# Patient Record
Sex: Female | Born: 1950 | Race: White | Hispanic: No | Smoking: Current some day smoker
Health system: Southern US, Community
[De-identification: ages and names within clinical notes are randomized; demographics above are authoritative.]

## PROBLEM LIST (undated history)

## (undated) DIAGNOSIS — I10 Essential (primary) hypertension: Secondary | ICD-10-CM

## (undated) DIAGNOSIS — M1990 Unspecified osteoarthritis, unspecified site: Secondary | ICD-10-CM

## (undated) DIAGNOSIS — J439 Emphysema, unspecified: Secondary | ICD-10-CM

## (undated) DIAGNOSIS — E785 Hyperlipidemia, unspecified: Secondary | ICD-10-CM

## (undated) DIAGNOSIS — F419 Anxiety disorder, unspecified: Secondary | ICD-10-CM

## (undated) DIAGNOSIS — E079 Disorder of thyroid, unspecified: Secondary | ICD-10-CM

## (undated) DIAGNOSIS — T7840XA Allergy, unspecified, initial encounter: Secondary | ICD-10-CM

## (undated) HISTORY — DX: Anxiety disorder, unspecified: F41.9

## (undated) HISTORY — DX: Essential (primary) hypertension: I10

## (undated) HISTORY — DX: Emphysema, unspecified: J43.9

## (undated) HISTORY — DX: Unspecified osteoarthritis, unspecified site: M19.90

## (undated) HISTORY — PX: COLONOSCOPY: SHX174

## (undated) HISTORY — DX: Allergy, unspecified, initial encounter: T78.40XA

## (undated) HISTORY — DX: Disorder of thyroid, unspecified: E07.9

## (undated) HISTORY — PX: POLYPECTOMY: SHX149

## (undated) HISTORY — DX: Hyperlipidemia, unspecified: E78.5

---

## 1976-02-18 HISTORY — PX: VAGINAL HYSTERECTOMY: SHX2639

## 1997-05-24 ENCOUNTER — Other Ambulatory Visit: Admission: RE | Admit: 1997-05-24 | Discharge: 1997-05-24 | Payer: Self-pay | Admitting: *Deleted

## 1998-02-23 ENCOUNTER — Ambulatory Visit (HOSPITAL_COMMUNITY): Admission: RE | Admit: 1998-02-23 | Discharge: 1998-02-23 | Payer: Self-pay | Admitting: Ophthalmology

## 1998-04-25 ENCOUNTER — Other Ambulatory Visit: Admission: RE | Admit: 1998-04-25 | Discharge: 1998-04-25 | Payer: Self-pay | Admitting: *Deleted

## 1999-06-24 ENCOUNTER — Other Ambulatory Visit: Admission: RE | Admit: 1999-06-24 | Discharge: 1999-06-24 | Payer: Self-pay | Admitting: *Deleted

## 2000-08-27 ENCOUNTER — Other Ambulatory Visit: Admission: RE | Admit: 2000-08-27 | Discharge: 2000-08-27 | Payer: Self-pay | Admitting: *Deleted

## 2006-01-26 ENCOUNTER — Ambulatory Visit: Payer: Self-pay | Admitting: Internal Medicine

## 2006-03-11 ENCOUNTER — Encounter (INDEPENDENT_AMBULATORY_CARE_PROVIDER_SITE_OTHER): Payer: Self-pay | Admitting: Specialist

## 2006-03-11 ENCOUNTER — Ambulatory Visit: Payer: Self-pay | Admitting: Internal Medicine

## 2008-11-18 ENCOUNTER — Ambulatory Visit: Payer: Self-pay | Admitting: Family Medicine

## 2008-11-18 DIAGNOSIS — J069 Acute upper respiratory infection, unspecified: Secondary | ICD-10-CM | POA: Insufficient documentation

## 2008-11-18 DIAGNOSIS — F411 Generalized anxiety disorder: Secondary | ICD-10-CM | POA: Insufficient documentation

## 2008-11-18 DIAGNOSIS — I10 Essential (primary) hypertension: Secondary | ICD-10-CM | POA: Insufficient documentation

## 2008-11-18 DIAGNOSIS — E785 Hyperlipidemia, unspecified: Secondary | ICD-10-CM | POA: Insufficient documentation

## 2013-02-23 ENCOUNTER — Encounter: Payer: Self-pay | Admitting: Internal Medicine

## 2013-05-19 ENCOUNTER — Other Ambulatory Visit: Payer: Self-pay | Admitting: Internal Medicine

## 2013-05-19 DIAGNOSIS — Z720 Tobacco use: Secondary | ICD-10-CM

## 2013-05-19 DIAGNOSIS — R059 Cough, unspecified: Secondary | ICD-10-CM

## 2013-05-19 DIAGNOSIS — R05 Cough: Secondary | ICD-10-CM

## 2013-05-24 ENCOUNTER — Ambulatory Visit
Admission: RE | Admit: 2013-05-24 | Discharge: 2013-05-24 | Disposition: A | Discharge status: Home / Self Care | Payer: BC Managed Care – PPO | Source: Ambulatory Visit | Attending: Internal Medicine | Admitting: Internal Medicine

## 2013-05-24 DIAGNOSIS — R059 Cough, unspecified: Secondary | ICD-10-CM

## 2013-05-24 DIAGNOSIS — R05 Cough: Secondary | ICD-10-CM

## 2013-05-24 DIAGNOSIS — Z720 Tobacco use: Secondary | ICD-10-CM

## 2015-03-18 IMAGING — CT CT CHEST W/O CM
3 of 4 series · 16 of 30 positions shown, 17 images · non-contrast
Comparison: None.

CLINICAL DATA: Cough.  Short of breath.  Cigarette smoker.

EXAM:
CT CHEST WITHOUT CONTRAST
TECHNIQUE: Multidetector CT imaging of the chest was performed following the
standard protocol without IV contrast.

[Series 3: chest w/o · axial · non-contrast · 0.70mm/px · z∈[-210,-40]mm · 4 of 58 slices shown, 5 images]
[im 12/58  mediastinal]
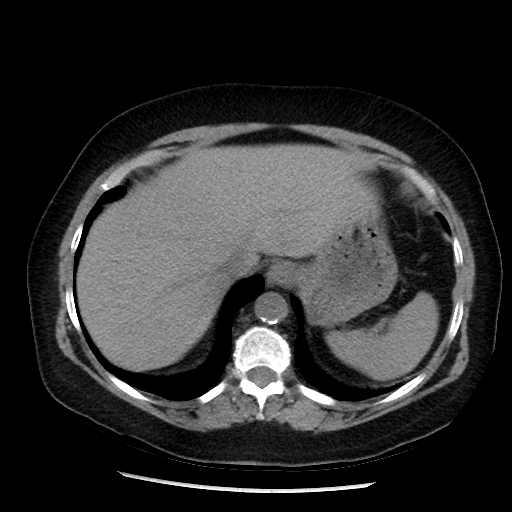
[im 12/58  lung]
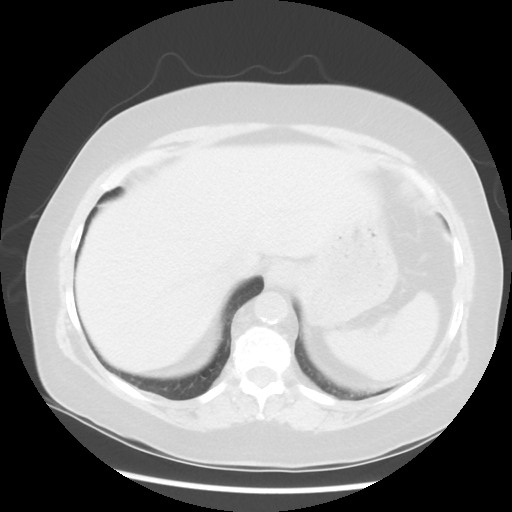
[im 23/58  lung]
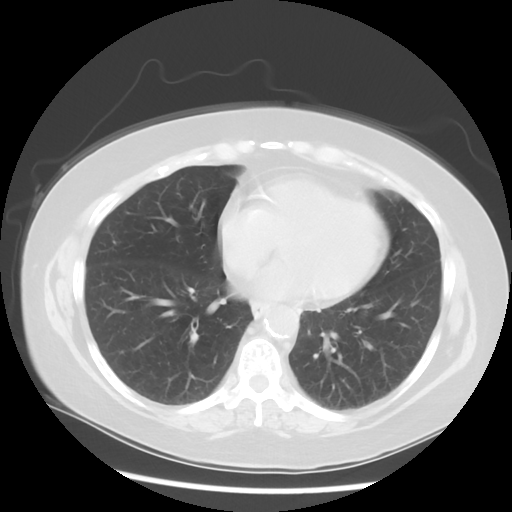
[im 35/58  lung]
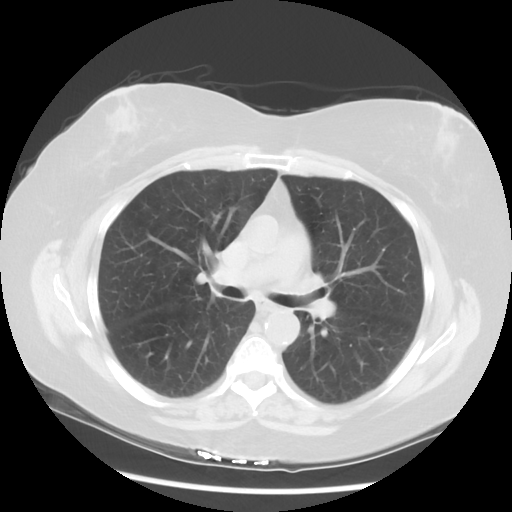
[im 46/58  lung]
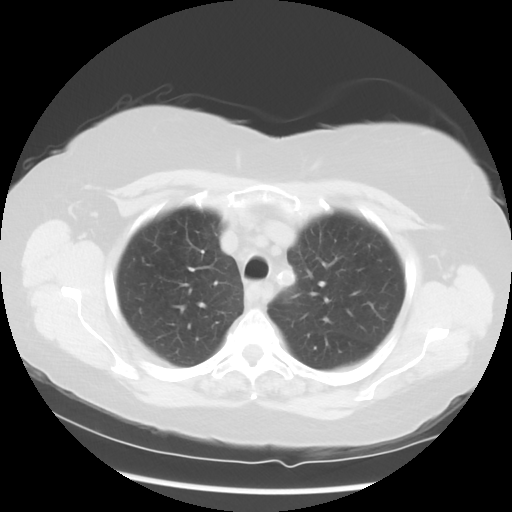

[Series 4: lung windows · axial · 0.70mm/px · z∈[-210,-40]mm · 4 of 58 slices shown]
[im 12/58  lung]
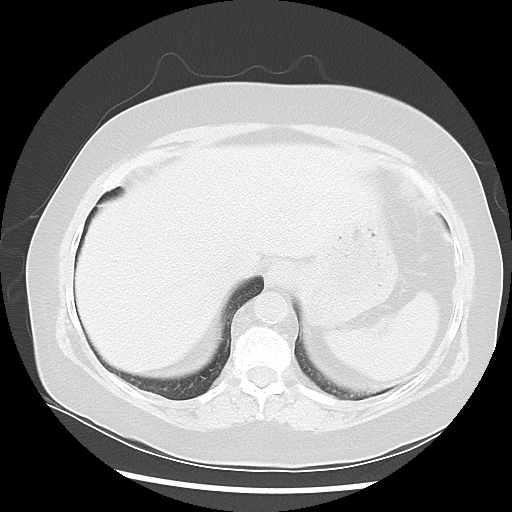
[im 23/58  lung]
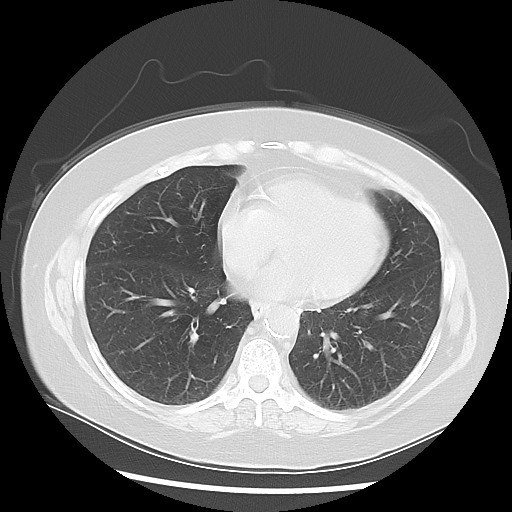
[im 35/58  lung]
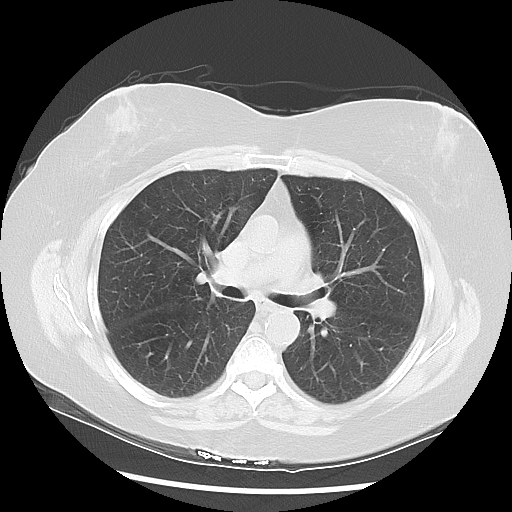
[im 46/58  lung]
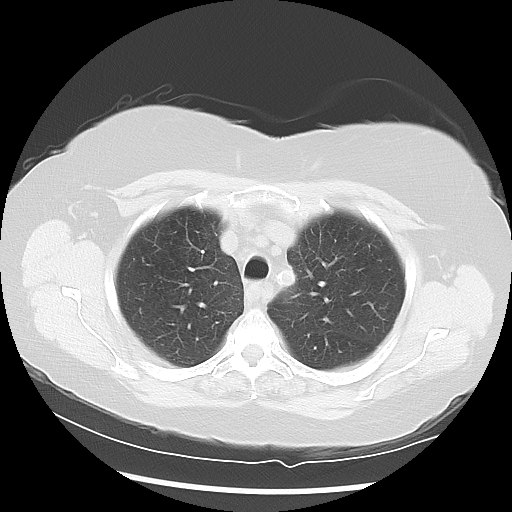

[Series 602: sagittal body · sagittal · 0.70mm/px · 8 of 145 slices shown]
[im 10/145  mediastinal]
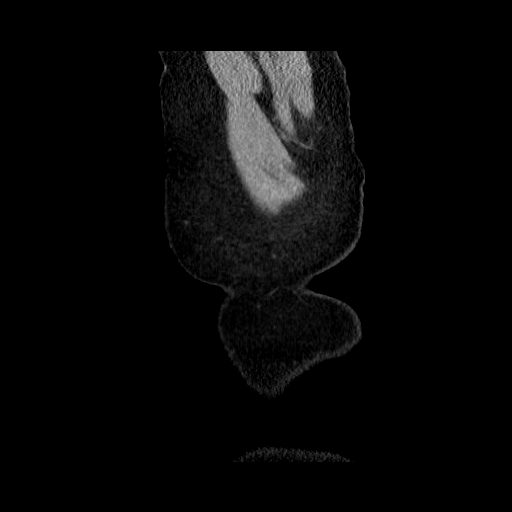
[im 29/145  mediastinal]
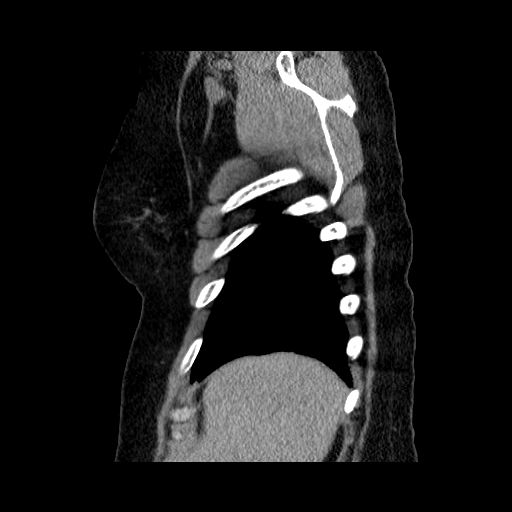
[im 49/145  mediastinal]
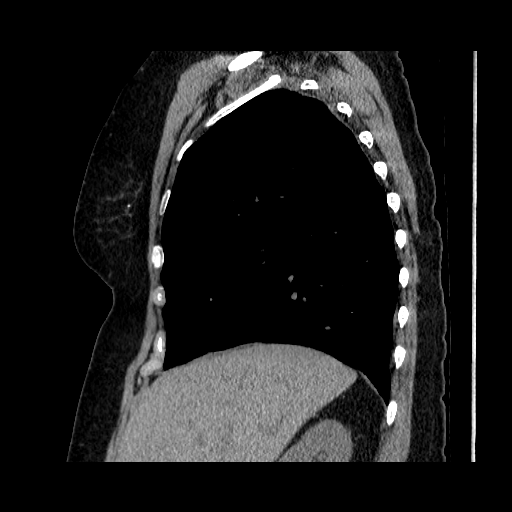
[im 68/145  mediastinal]
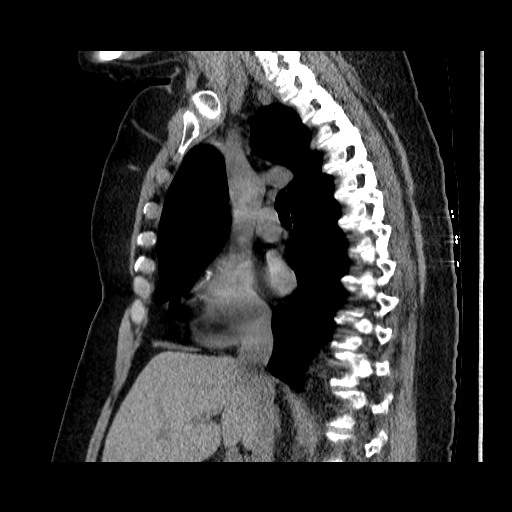
[im 77/145  mediastinal]
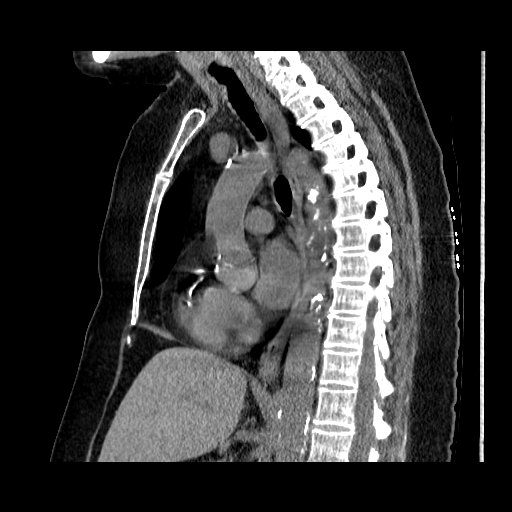
[im 97/145  mediastinal]
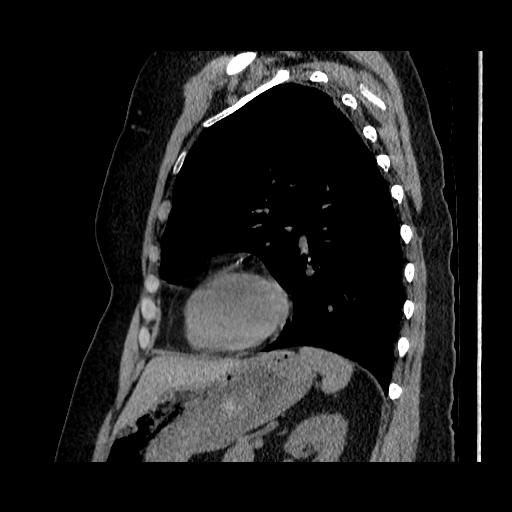
[im 116/145  mediastinal]
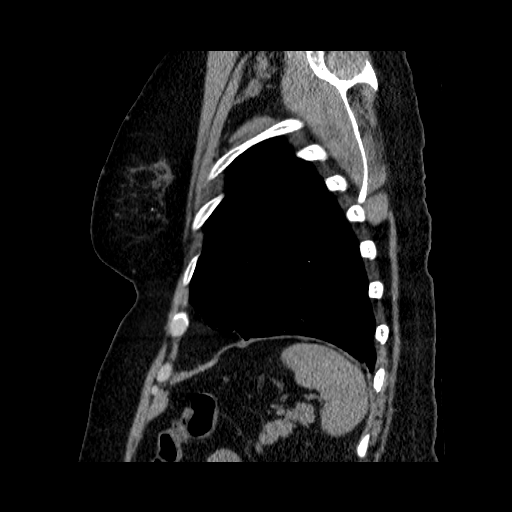
[im 135/145  mediastinal]
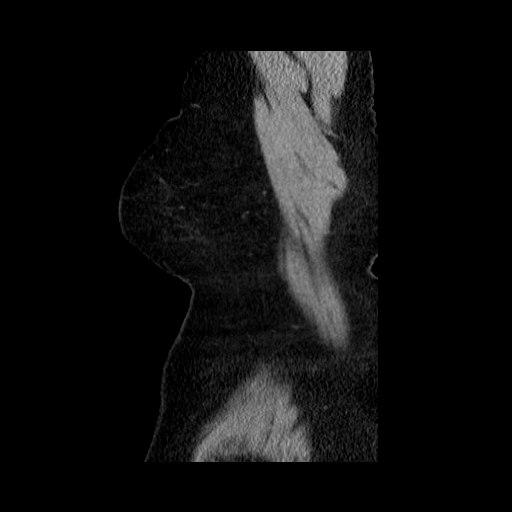

[16 of 30 positions shown; findings below may reference images not displayed]

FINDINGS: There is no axillary adenopathy. No mediastinal adenopathy. No hilar
adenopathy allowing for noncontrast technique. Coronary artery
atherosclerosis is present. If office based assessment of coronary
risk factors has not been performed, it is now recommended. No
pericardial or pleural effusion. Aortic and branch vessel
atherosclerotic calcification is present. Aberrant right subclavian
artery is present which passes posterior to the esophagus, and also
demonstrates atherosclerotic calcification. Incidental imaging of
the upper abdomen is within normal limits. The lungs demonstrate
moderate centrilobular emphysema. No pulmonary mass lesion. Mild
dependent atelectasis. There are no aggressive osseous lesions.
Striated pattern of the bones in the thoracic vertebral column
suggesting osteopenia.
IMPRESSION: 1. Emphysema.
2. Atherosclerosis and coronary artery disease.
3. No active cardiopulmonary disease.

## 2016-02-29 ENCOUNTER — Other Ambulatory Visit: Payer: Self-pay | Admitting: Internal Medicine

## 2016-02-29 DIAGNOSIS — Z720 Tobacco use: Secondary | ICD-10-CM

## 2016-03-13 ENCOUNTER — Encounter: Payer: Self-pay | Admitting: Gastroenterology

## 2016-04-07 ENCOUNTER — Encounter: Payer: Self-pay | Admitting: Gastroenterology

## 2016-05-29 ENCOUNTER — Ambulatory Visit (AMBULATORY_SURGERY_CENTER): Payer: Self-pay

## 2016-05-29 VITALS — Ht 59.0 in | Wt 156.8 lb

## 2016-05-29 DIAGNOSIS — Z1211 Encounter for screening for malignant neoplasm of colon: Secondary | ICD-10-CM

## 2016-05-29 MED ORDER — NA SULFATE-K SULFATE-MG SULF 17.5-3.13-1.6 GM/177ML PO SOLN: 1.0000 | Freq: Once | ORAL | 0 refills | Status: AC

## 2016-05-29 NOTE — Progress Notes (Signed)
Denies allergies to eggs or soy products. Denies complication of anesthesia or sedation. Denies use of weight loss medication. Denies use of O2.   Emmi instructions declined, patient doesn't have a computer.

## 2016-05-30 ENCOUNTER — Encounter: Payer: Self-pay | Admitting: Gastroenterology

## 2016-06-10 ENCOUNTER — Encounter: Payer: Self-pay | Admitting: Gastroenterology

## 2016-06-10 ENCOUNTER — Ambulatory Visit (AMBULATORY_SURGERY_CENTER): Payer: Medicare Other | Admitting: Gastroenterology

## 2016-06-10 VITALS — BP 119/66 | HR 84 | Temp 98.4°F | Resp 14 | Ht 59.0 in | Wt 156.0 lb

## 2016-06-10 DIAGNOSIS — K621 Rectal polyp: Secondary | ICD-10-CM | POA: Diagnosis not present

## 2016-06-10 DIAGNOSIS — Z1212 Encounter for screening for malignant neoplasm of rectum: Secondary | ICD-10-CM | POA: Diagnosis not present

## 2016-06-10 DIAGNOSIS — D128 Benign neoplasm of rectum: Secondary | ICD-10-CM

## 2016-06-10 DIAGNOSIS — Z800 Family history of malignant neoplasm of digestive organs: Secondary | ICD-10-CM | POA: Diagnosis not present

## 2016-06-10 DIAGNOSIS — Z1211 Encounter for screening for malignant neoplasm of colon: Secondary | ICD-10-CM

## 2016-06-10 MED ORDER — SODIUM CHLORIDE 0.9 % IV SOLN: 500.0000 mL | INTRAVENOUS | Status: DC

## 2016-06-10 NOTE — Op Note (Signed)
[facility] Patient Name: Olivia Cook Procedure Date: 06/10/2016 9:07 AM MRN: [id number] Endoscopist: Remo Lipps P. Sagan Wurzel MD, MD Age: 66 Referring MD:  Date of Birth: [birth year] Gender: Female Account #: [id number] Procedure:                Colonoscopy Indications:              Screening in patient at increased risk: Family                            history of 1st-degree relative with colorectal                            cancer (sister age 66s) Medicines:                Monitored Anesthesia Care Procedure:                Pre-Anesthesia Assessment:                           - Prior to the procedure, a History and Physical                            was performed, and patient medications and                            allergies were reviewed. The patient's tolerance of                            previous anesthesia was also reviewed. The risks                            and benefits of the procedure and the sedation                            options and risks were discussed with the patient.                            All questions were answered, and informed consent                            was obtained. Prior Anticoagulants: The patient has                            taken no previous anticoagulant or antiplatelet                            agents. ASA Grade Assessment: II - A patient with                            mild systemic disease. After reviewing the risks                            and benefits, the patient was deemed in  satisfactory condition to undergo the procedure.                           After obtaining informed consent, the colonoscope                            was passed under direct vision. Throughout the                            procedure, the patient's blood pressure, pulse, and                            oxygen saturations were monitored continuously. The                            Colonoscope was introduced through  the anus and                            advanced to the the cecum, identified by                            appendiceal orifice and ileocecal valve. The                            colonoscopy was performed without difficulty. The                            patient tolerated the procedure well. The quality                            of the bowel preparation was good. The terminal                            ileum, ileocecal valve, appendiceal orifice, and                            rectum were photographed. Scope In: 9:11:11 AM Scope Out: 9:28:51 AM Scope Withdrawal Time: 0 hours 15 minutes 22 seconds  Total Procedure Duration: 0 hours 17 minutes 40 seconds  Findings:                 The perianal and digital rectal examinations were                            normal.                           Five sessile polyps were found in the rectum. The                            polyps were 3 to 4 mm in size. These polyps were                            removed with a cold snare. Resection and retrieval  were complete.                           Many medium-mouthed diverticula were found in the                            left colon.                           Internal hemorrhoids were found during retroflexion.                           The exam was otherwise without abnormality. Complications:            No immediate complications. Estimated blood loss:                            Minimal. Estimated Blood Loss:     Estimated blood loss was minimal. Impression:               - Five 3 to 4 mm polyps in the rectum, removed with                            a cold snare. Resected and retrieved. Suspect                            benign hyperplastic polyps                           - Diverticulosis in the left colon.                           - Internal hemorrhoids.                           - The examination was otherwise normal. Recommendation:           - Patient has a contact  number available for                            emergencies. The signs and symptoms of potential                            delayed complications were discussed with the                            patient. Return to normal activities tomorrow.                            Written discharge instructions were provided to the                            patient.                           - Resume previous diet.                           -  Continue present medications.                           - No ibuprofen, naproxen, or other non-steroidal                            anti-inflammatory drugs for 2 weeks after polyp                            removal.                           - Await pathology results.                           - Repeat colonoscopy is recommended for                            surveillance. The colonoscopy date will be                            determined after pathology results from today's                            exam become available for review. Remo Lipps P. Carah Barrientes MD, MD 06/10/2016 9:33:26 AM This report has been signed electronically.

## 2016-06-10 NOTE — Progress Notes (Signed)
Pt's states no medical or surgical changes since previsit or office visit. 

## 2016-06-10 NOTE — Progress Notes (Signed)
TO PACU  Pt awake and alert, report to RN 

## 2016-06-10 NOTE — Progress Notes (Signed)
Called to room to assist during endoscopic procedure.  Patient ID and intended procedure confirmed with present staff. Received instructions for my participation in the procedure from the performing physician.  

## 2016-06-10 NOTE — Patient Instructions (Signed)
Handout given on polyps  YOU HAD AN ENDOSCOPIC PROCEDURE TODAY: Refer to the procedure report and other information in the discharge instructions given to you for any specific questions about what was found during the examination. If this information does not answer your questions, please call [facility] office at [phone] to clarify.   YOU SHOULD EXPECT: Some feelings of bloating in the abdomen. Passage of more gas than usual. Walking can help get rid of the air that was put into your GI tract during the procedure and reduce the bloating. If you had a lower endoscopy (such as a colonoscopy or flexible sigmoidoscopy) you may notice spotting of blood in your stool or on the toilet paper. Some abdominal soreness may be present for a day or two, also.  DIET: Your first meal following the procedure should be a light meal and then it is ok to progress to your normal diet. A half-sandwich or bowl of soup is an example of a good first meal. Heavy or fried foods are harder to digest and may make you feel nauseous or bloated. Drink plenty of fluids but you should avoid alcoholic beverages for 24 hours. If you had a esophageal dilation, please see attached instructions for diet.    ACTIVITY: Your care partner should take you home directly after the procedure. You should plan to take it easy, moving slowly for the rest of the day. You can resume normal activity the day after the procedure however YOU SHOULD NOT DRIVE, use power tools, machinery or perform tasks that involve climbing or major physical exertion for 24 hours (because of the sedation medicines used during the test).   SYMPTOMS TO REPORT IMMEDIATELY: A gastroenterologist can be reached at any hour. Please call [phone]  for any of the following symptoms:  Following lower endoscopy (colonoscopy, flexible sigmoidoscopy) Excessive amounts of blood in the stool  Significant tenderness, worsening of abdominal pains  Swelling of the abdomen that is  new, acute  Fever of 100 or higher    FOLLOW UP:  If any biopsies were taken you will be contacted by phone or by letter within the next 1-3 weeks. Call [phone]  if you have not heard about the biopsies in 3 weeks.  Please also call with any specific questions about appointments or follow up tests.  

## 2016-06-11 ENCOUNTER — Telehealth: Payer: Self-pay

## 2016-06-11 NOTE — Telephone Encounter (Signed)
  Follow up Call-  Call back number 06/10/2016  Post procedure Call Back phone  # [phone]  Permission to leave phone message Yes  Some recent data might be hidden     Patient questions:  Do you have a fever, pain , or abdominal swelling? No. Pain Score  0 *  Have you tolerated food without any problems? Yes.    Have you been able to return to your normal activities? Yes.    Do you have any questions about your discharge instructions: Diet   No. Medications  No. Follow up visit  No.  Do you have questions or concerns about your Care? No.  Actions: * If pain score is 4 or above: No action needed, pain <4.

## 2016-06-13 ENCOUNTER — Encounter: Payer: Self-pay | Admitting: Gastroenterology

## 2018-10-22 ENCOUNTER — Other Ambulatory Visit (HOSPITAL_COMMUNITY): Payer: Self-pay | Admitting: Internal Medicine

## 2018-10-22 DIAGNOSIS — R0989 Other specified symptoms and signs involving the circulatory and respiratory systems: Secondary | ICD-10-CM

## 2018-10-27 ENCOUNTER — Other Ambulatory Visit: Payer: Self-pay

## 2018-10-27 ENCOUNTER — Ambulatory Visit (HOSPITAL_COMMUNITY)
Admission: RE | Admit: 2018-10-27 | Discharge: 2018-10-27 | Disposition: A | Discharge status: Home / Self Care | Payer: Medicare Other | Source: Ambulatory Visit | Attending: Family | Admitting: Family

## 2018-10-27 DIAGNOSIS — R0989 Other specified symptoms and signs involving the circulatory and respiratory systems: Secondary | ICD-10-CM | POA: Diagnosis present

## 2019-11-30 DIAGNOSIS — Z1231 Encounter for screening mammogram for malignant neoplasm of breast: Secondary | ICD-10-CM | POA: Diagnosis not present

## 2020-02-21 DIAGNOSIS — R110 Nausea: Secondary | ICD-10-CM | POA: Diagnosis not present

## 2020-02-21 DIAGNOSIS — R5383 Other fatigue: Secondary | ICD-10-CM | POA: Diagnosis not present

## 2020-02-21 DIAGNOSIS — R195 Other fecal abnormalities: Secondary | ICD-10-CM | POA: Diagnosis not present

## 2020-02-21 DIAGNOSIS — J439 Emphysema, unspecified: Secondary | ICD-10-CM | POA: Diagnosis not present

## 2020-02-21 DIAGNOSIS — R059 Cough, unspecified: Secondary | ICD-10-CM | POA: Diagnosis not present

## 2020-02-21 DIAGNOSIS — Z8719 Personal history of other diseases of the digestive system: Secondary | ICD-10-CM | POA: Diagnosis not present

## 2020-02-21 DIAGNOSIS — J189 Pneumonia, unspecified organism: Secondary | ICD-10-CM | POA: Diagnosis not present

## 2020-02-21 DIAGNOSIS — Z1152 Encounter for screening for COVID-19: Secondary | ICD-10-CM | POA: Diagnosis not present

## 2020-03-13 DIAGNOSIS — R059 Cough, unspecified: Secondary | ICD-10-CM | POA: Diagnosis not present

## 2020-03-28 DIAGNOSIS — J439 Emphysema, unspecified: Secondary | ICD-10-CM | POA: Diagnosis not present

## 2020-03-28 DIAGNOSIS — J984 Other disorders of lung: Secondary | ICD-10-CM | POA: Diagnosis not present

## 2020-04-12 DIAGNOSIS — N281 Cyst of kidney, acquired: Secondary | ICD-10-CM | POA: Diagnosis not present

## 2020-04-19 ENCOUNTER — Other Ambulatory Visit (HOSPITAL_COMMUNITY): Payer: Self-pay | Admitting: Urology

## 2020-04-19 ENCOUNTER — Other Ambulatory Visit: Payer: Self-pay | Admitting: Urology

## 2020-04-19 DIAGNOSIS — N281 Cyst of kidney, acquired: Secondary | ICD-10-CM

## 2020-04-26 DIAGNOSIS — E782 Mixed hyperlipidemia: Secondary | ICD-10-CM | POA: Diagnosis not present

## 2020-04-26 DIAGNOSIS — E039 Hypothyroidism, unspecified: Secondary | ICD-10-CM | POA: Diagnosis not present

## 2020-04-26 DIAGNOSIS — R7301 Impaired fasting glucose: Secondary | ICD-10-CM | POA: Diagnosis not present

## 2020-04-27 ENCOUNTER — Ambulatory Visit (HOSPITAL_COMMUNITY): Payer: Medicare PPO

## 2020-04-27 ENCOUNTER — Encounter (HOSPITAL_COMMUNITY): Payer: Self-pay

## 2020-05-02 DIAGNOSIS — K760 Fatty (change of) liver, not elsewhere classified: Secondary | ICD-10-CM | POA: Diagnosis not present

## 2020-05-02 DIAGNOSIS — N281 Cyst of kidney, acquired: Secondary | ICD-10-CM | POA: Diagnosis not present

## 2020-05-03 DIAGNOSIS — R82998 Other abnormal findings in urine: Secondary | ICD-10-CM | POA: Diagnosis not present

## 2020-05-03 DIAGNOSIS — R911 Solitary pulmonary nodule: Secondary | ICD-10-CM | POA: Diagnosis not present

## 2020-05-03 DIAGNOSIS — R7301 Impaired fasting glucose: Secondary | ICD-10-CM | POA: Diagnosis not present

## 2020-05-03 DIAGNOSIS — Z720 Tobacco use: Secondary | ICD-10-CM | POA: Diagnosis not present

## 2020-05-03 DIAGNOSIS — R21 Rash and other nonspecific skin eruption: Secondary | ICD-10-CM | POA: Diagnosis not present

## 2020-05-03 DIAGNOSIS — Z1339 Encounter for screening examination for other mental health and behavioral disorders: Secondary | ICD-10-CM | POA: Diagnosis not present

## 2020-05-03 DIAGNOSIS — Z0000 Encounter for general adult medical examination without abnormal findings: Secondary | ICD-10-CM | POA: Diagnosis not present

## 2020-05-03 DIAGNOSIS — I10 Essential (primary) hypertension: Secondary | ICD-10-CM | POA: Diagnosis not present

## 2020-05-03 DIAGNOSIS — Z1331 Encounter for screening for depression: Secondary | ICD-10-CM | POA: Diagnosis not present

## 2020-05-03 DIAGNOSIS — E039 Hypothyroidism, unspecified: Secondary | ICD-10-CM | POA: Diagnosis not present

## 2020-05-03 DIAGNOSIS — E782 Mixed hyperlipidemia: Secondary | ICD-10-CM | POA: Diagnosis not present

## 2020-05-03 DIAGNOSIS — Z1212 Encounter for screening for malignant neoplasm of rectum: Secondary | ICD-10-CM | POA: Diagnosis not present

## 2020-05-03 DIAGNOSIS — J439 Emphysema, unspecified: Secondary | ICD-10-CM | POA: Diagnosis not present

## 2020-05-07 DIAGNOSIS — N281 Cyst of kidney, acquired: Secondary | ICD-10-CM | POA: Diagnosis not present

## 2020-08-08 DIAGNOSIS — R911 Solitary pulmonary nodule: Secondary | ICD-10-CM | POA: Diagnosis not present

## 2020-08-08 DIAGNOSIS — J432 Centrilobular emphysema: Secondary | ICD-10-CM | POA: Diagnosis not present

## 2020-08-08 DIAGNOSIS — J9811 Atelectasis: Secondary | ICD-10-CM | POA: Diagnosis not present

## 2020-08-08 DIAGNOSIS — J449 Chronic obstructive pulmonary disease, unspecified: Secondary | ICD-10-CM | POA: Diagnosis not present

## 2020-10-25 DIAGNOSIS — R0602 Shortness of breath: Secondary | ICD-10-CM | POA: Diagnosis not present

## 2020-10-25 DIAGNOSIS — J069 Acute upper respiratory infection, unspecified: Secondary | ICD-10-CM | POA: Diagnosis not present

## 2020-10-25 DIAGNOSIS — R058 Other specified cough: Secondary | ICD-10-CM | POA: Diagnosis not present

## 2020-10-25 DIAGNOSIS — R0981 Nasal congestion: Secondary | ICD-10-CM | POA: Diagnosis not present

## 2020-10-25 DIAGNOSIS — Z1152 Encounter for screening for COVID-19: Secondary | ICD-10-CM | POA: Diagnosis not present

## 2020-10-29 DIAGNOSIS — R5383 Other fatigue: Secondary | ICD-10-CM | POA: Diagnosis not present

## 2020-10-29 DIAGNOSIS — J069 Acute upper respiratory infection, unspecified: Secondary | ICD-10-CM | POA: Diagnosis not present

## 2020-10-29 DIAGNOSIS — R058 Other specified cough: Secondary | ICD-10-CM | POA: Diagnosis not present

## 2020-11-12 DIAGNOSIS — J439 Emphysema, unspecified: Secondary | ICD-10-CM | POA: Diagnosis not present

## 2020-11-12 DIAGNOSIS — I10 Essential (primary) hypertension: Secondary | ICD-10-CM | POA: Diagnosis not present

## 2020-11-12 DIAGNOSIS — R7301 Impaired fasting glucose: Secondary | ICD-10-CM | POA: Diagnosis not present

## 2020-11-12 DIAGNOSIS — Z720 Tobacco use: Secondary | ICD-10-CM | POA: Diagnosis not present

## 2020-11-12 DIAGNOSIS — E039 Hypothyroidism, unspecified: Secondary | ICD-10-CM | POA: Diagnosis not present

## 2020-11-12 DIAGNOSIS — Z23 Encounter for immunization: Secondary | ICD-10-CM | POA: Diagnosis not present

## 2021-01-28 DIAGNOSIS — M85852 Other specified disorders of bone density and structure, left thigh: Secondary | ICD-10-CM | POA: Diagnosis not present

## 2021-01-28 DIAGNOSIS — Z780 Asymptomatic menopausal state: Secondary | ICD-10-CM | POA: Diagnosis not present

## 2021-01-28 DIAGNOSIS — Z1231 Encounter for screening mammogram for malignant neoplasm of breast: Secondary | ICD-10-CM | POA: Diagnosis not present

## 2021-05-22 DIAGNOSIS — N281 Cyst of kidney, acquired: Secondary | ICD-10-CM | POA: Diagnosis not present

## 2021-06-07 DIAGNOSIS — N281 Cyst of kidney, acquired: Secondary | ICD-10-CM | POA: Diagnosis not present

## 2021-06-10 DIAGNOSIS — I10 Essential (primary) hypertension: Secondary | ICD-10-CM | POA: Diagnosis not present

## 2021-06-10 DIAGNOSIS — E039 Hypothyroidism, unspecified: Secondary | ICD-10-CM | POA: Diagnosis not present

## 2021-06-10 DIAGNOSIS — Z79899 Other long term (current) drug therapy: Secondary | ICD-10-CM | POA: Diagnosis not present

## 2021-06-10 DIAGNOSIS — E782 Mixed hyperlipidemia: Secondary | ICD-10-CM | POA: Diagnosis not present

## 2021-06-10 DIAGNOSIS — R7301 Impaired fasting glucose: Secondary | ICD-10-CM | POA: Diagnosis not present

## 2021-06-17 DIAGNOSIS — J439 Emphysema, unspecified: Secondary | ICD-10-CM | POA: Diagnosis not present

## 2021-06-17 DIAGNOSIS — Z0000 Encounter for general adult medical examination without abnormal findings: Secondary | ICD-10-CM | POA: Diagnosis not present

## 2021-06-17 DIAGNOSIS — I10 Essential (primary) hypertension: Secondary | ICD-10-CM | POA: Diagnosis not present

## 2021-06-17 DIAGNOSIS — Z720 Tobacco use: Secondary | ICD-10-CM | POA: Diagnosis not present

## 2021-06-17 DIAGNOSIS — E782 Mixed hyperlipidemia: Secondary | ICD-10-CM | POA: Diagnosis not present

## 2021-06-17 DIAGNOSIS — E039 Hypothyroidism, unspecified: Secondary | ICD-10-CM | POA: Diagnosis not present

## 2021-06-17 DIAGNOSIS — G8929 Other chronic pain: Secondary | ICD-10-CM | POA: Diagnosis not present

## 2021-06-17 DIAGNOSIS — R7301 Impaired fasting glucose: Secondary | ICD-10-CM | POA: Diagnosis not present

## 2021-06-17 DIAGNOSIS — R82998 Other abnormal findings in urine: Secondary | ICD-10-CM | POA: Diagnosis not present

## 2021-06-17 DIAGNOSIS — I700 Atherosclerosis of aorta: Secondary | ICD-10-CM | POA: Diagnosis not present

## 2021-07-02 ENCOUNTER — Encounter: Payer: Self-pay | Admitting: Gastroenterology

## 2021-07-12 ENCOUNTER — Encounter: Payer: Self-pay | Admitting: Gastroenterology

## 2021-07-31 ENCOUNTER — Ambulatory Visit (AMBULATORY_SURGERY_CENTER): Payer: Self-pay | Admitting: *Deleted

## 2021-07-31 VITALS — Ht 59.0 in | Wt 139.0 lb

## 2021-07-31 DIAGNOSIS — Z800 Family history of malignant neoplasm of digestive organs: Secondary | ICD-10-CM

## 2021-07-31 MED ORDER — NA SULFATE-K SULFATE-MG SULF 17.5-3.13-1.6 GM/177ML PO SOLN: 1.0000 | Freq: Once | ORAL | 0 refills | Status: AC

## 2021-07-31 NOTE — Progress Notes (Signed)
No egg or soy allergy known to patient  No issues known to pt with past sedation with any surgeries or procedures Patient denies ever being told they had issues or difficulty with intubation  No FH of Malignant Hyperthermia Pt is not on diet pills Pt is not on  home 02  Pt is not on blood thinners  Pt denies issues with constipation  No A fib or A flutter  NO PA's for preps discussed with pt In PV today  Discussed with pt there will be an out-of-pocket cost for prep and that varies from $0 to 70 +  dollars - pt verbalized understanding  Pt instructed to use GoodRx for a price reduction on prep

## 2021-08-12 ENCOUNTER — Other Ambulatory Visit (HOSPITAL_COMMUNITY): Payer: Self-pay

## 2021-08-12 ENCOUNTER — Telehealth: Payer: Self-pay | Admitting: Gastroenterology

## 2021-08-12 ENCOUNTER — Telehealth: Payer: Self-pay | Admitting: Pharmacy Technician

## 2021-08-12 DIAGNOSIS — Z800 Family history of malignant neoplasm of digestive organs: Secondary | ICD-10-CM

## 2021-08-12 MED ORDER — NA SULFATE-K SULFATE-MG SULF 17.5-3.13-1.6 GM/177ML PO SOLN: 1.0000 | Freq: Once | ORAL | 0 refills | Status: DC

## 2021-08-12 MED ORDER — NA SULFATE-K SULFATE-MG SULF 17.5-3.13-1.6 GM/177ML PO SOLN: 1.0000 | Freq: Once | ORAL | 0 refills | Status: AC

## 2021-08-13 NOTE — Telephone Encounter (Signed)
Pt was instructed to use Good Rx at [employer] 31-36 $ If she wants to change let me know  it would have to be changed to [place]- we do not do PA for bowel preps

## 2021-08-14 ENCOUNTER — Encounter: Payer: Self-pay | Admitting: Gastroenterology

## 2021-08-21 ENCOUNTER — Ambulatory Visit (AMBULATORY_SURGERY_CENTER): Payer: Medicare PPO | Admitting: Gastroenterology

## 2021-08-21 ENCOUNTER — Encounter: Payer: Self-pay | Admitting: Gastroenterology

## 2021-08-21 VITALS — BP 125/50 | HR 68 | Temp 98.2°F | Resp 21 | Ht 59.0 in | Wt 139.0 lb

## 2021-08-21 DIAGNOSIS — D479 Neoplasm of uncertain behavior of lymphoid, hematopoietic and related tissue, unspecified: Secondary | ICD-10-CM | POA: Diagnosis not present

## 2021-08-21 DIAGNOSIS — Z800 Family history of malignant neoplasm of digestive organs: Secondary | ICD-10-CM

## 2021-08-21 DIAGNOSIS — D125 Benign neoplasm of sigmoid colon: Secondary | ICD-10-CM

## 2021-08-21 DIAGNOSIS — Z1211 Encounter for screening for malignant neoplasm of colon: Secondary | ICD-10-CM

## 2021-08-21 MED ORDER — SODIUM CHLORIDE 0.9 % IV SOLN: 500.0000 mL | Freq: Once | INTRAVENOUS | Status: DC

## 2021-08-21 NOTE — Op Note (Signed)
[facility] Patient Name: Olivia Cook Procedure Date: 08/21/2021 9:05 AM MRN: [id number] Endoscopist: Remo Lipps P. Ha[place] Olivia Cook , MD Age: 71 Referring MD:  Date of Birth: May 07, 1950 Gender: Female Account #: [id number] Procedure:                Colonoscopy Indications:              Screening in patient at increased risk: Family                            history of sister with colorectal cancer diagnosed                            age 66s Medicines:                Monitored Anesthesia Care Procedure:                Pre-Anesthesia Assessment:                           - Prior to the procedure, a History and Physical                            was performed, and patient medications and                            allergies were reviewed. The patient's tolerance of                            previous anesthesia was also reviewed. The risks                            and benefits of the procedure and the sedation                            options and risks were discussed with the patient.                            All questions were answered, and informed consent                            was obtained. Prior Anticoagulants: The patient has                            taken no previous anticoagulant or antiplatelet                            agents. ASA Grade Assessment: II - A patient with                            mild systemic disease. After reviewing the risks                            and benefits, the patient was deemed in  satisfactory condition to undergo the procedure.                           After obtaining informed consent, the colonoscope                            was passed under direct vision. Throughout the                            procedure, the patient's blood pressure, pulse, and                            oxygen saturations were monitored continuously. The                            PCF-HQ190L Colonoscope was introduced through the                             anus and advanced to the the cecum, identified by                            appendiceal orifice and ileocecal valve. The                            colonoscopy was performed without difficulty. The                            patient tolerated the procedure well. The quality                            of the bowel preparation was adequate. The                            ileocecal valve, appendiceal orifice, and rectum                            were photographed. Scope In: 9:15:43 AM Scope Out: 9:36:27 AM Scope Withdrawal Time: 0 hours 18 minutes 10 seconds  Total Procedure Duration: 0 hours 20 minutes 44 seconds  Findings:                 The perianal and digital rectal examinations were                            normal.                           A diffuse area of altered mucosa was found in the                            cecum / ascending colon. Suspect this most likely                            represents benign lymphoid aggregates. Biopsies  were taken with a cold forceps for histology to                            ensure no flat adenomatous change.                           Many small-mouthed diverticula were found in the                            sigmoid colon.                           Three sessile polyps were found in the sigmoid                            colon. The polyps were 3 to 6 mm in size. These                            polyps were removed with a cold snare. Resection                            and retrieval were complete.                           Internal hemorrhoids were found during retroflexion.                           The exam was otherwise without abnormality. Complications:            No immediate complications. Estimated blood loss:                            Minimal. Estimated Blood Loss:     Estimated blood loss was minimal. Impression:               - Altered mucosa in the cecum - suspect benign                             lymphoid aggregates / normal variant. Biopsied to                            ensure no flat adenomatous change.                           - Diverticulosis in the sigmoid colon.                           - Three 3 to 6 mm polyps in the sigmoid colon,                            removed with a cold snare. Resected and retrieved.                           - Internal hemorrhoids.                           -  The examination was otherwise normal. Recommendation:           - Patient has a contact number available for                            emergencies. The signs and symptoms of potential                            delayed complications were discussed with the                            patient. Return to normal activities tomorrow.                            Written discharge instructions were provided to the                            patient.                           - Resume previous diet.                           - Continue present medications.                           - Await pathology results. Remo Lipps P. Olivia Liskey, MD 08/21/2021 9:41:49 AM This report has been signed electronically.

## 2021-08-21 NOTE — Progress Notes (Signed)
Pt's states no medical or surgical changes since previsit or office visit. 

## 2021-08-21 NOTE — Progress Notes (Signed)
Sedate, gd SR, tolerated procedure well, VSS, report to RN 

## 2021-08-21 NOTE — Progress Notes (Signed)
Called to room to assist during endoscopic procedure.  Patient ID and intended procedure confirmed with present staff. Received instructions for my participation in the procedure from the performing physician.  

## 2021-08-21 NOTE — Patient Instructions (Signed)
Handouts given for diverticulosis and polyps.  YOU HAD AN ENDOSCOPIC PROCEDURE TODAY AT [facility]:   Refer to the procedure report that was given to you for any specific questions about what was found during the examination.  If the procedure report does not answer your questions, please call your gastroenterologist to clarify.  If you requested that your care partner not be given the details of your procedure findings, then the procedure report has been included in a sealed envelope for you to review at your convenience later.  YOU SHOULD EXPECT: Some feelings of bloating in the abdomen. Passage of more gas than usual.  Walking can help get rid of the air that was put into your GI tract during the procedure and reduce the bloating. If you had a lower endoscopy (such as a colonoscopy or flexible sigmoidoscopy) you may notice spotting of blood in your stool or on the toilet paper. If you underwent a bowel prep for your procedure, you may not have a normal bowel movement for a few days.  Please Note:  You might notice some irritation and congestion in your nose or some drainage.  This is from the oxygen used during your procedure.  There is no need for concern and it should clear up in a day or so.  SYMPTOMS TO REPORT IMMEDIATELY:  Following lower endoscopy (colonoscopy or flexible sigmoidoscopy):  Excessive amounts of blood in the stool  Significant tenderness or worsening of abdominal pains  Swelling of the abdomen that is new, acute  Fever of 100F or higher  For urgent or emergent issues, a gastroenterologist can be reached at any hour by calling [phone]. Do not use MyChart messaging for urgent concerns.    DIET:  We do recommend a small meal at first, but then you may proceed to your regular diet.  Drink plenty of fluids but you should avoid alcoholic beverages for 24 hours.  ACTIVITY:  You should plan to take it easy for the rest of today and you should NOT DRIVE  or use heavy machinery until tomorrow (because of the sedation medicines used during the test).    FOLLOW UP: Our staff will call the number listed on your records the next business day following your procedure.  We will call around 7:15- 8:00 am to check on you and address any questions or concerns that you may have regarding the information given to you following your procedure. If we do not reach you, we will leave a message.  If you develop any symptoms (ie: fever, flu-like symptoms, shortness of breath, cough etc.) before then, please call [phone].  If you test positive for Covid 19 in the 2 weeks post procedure, please call and report this information to [place].    If any biopsies were taken you will be contacted by phone or by letter within the next 1-3 weeks.  Please call us at [phone] if you have not heard about the biopsies in 3 weeks.    SIGNATURES/CONFIDENTIALITY: You and/or your care partner have signed paperwork which will be entered into your electronic medical record.  These signatures attest to the fact that that the information above on your After Visit Summary has been reviewed and is understood.  Full responsibility of the confidentiality of this discharge information lies with you and/or your care-partner.

## 2021-08-21 NOTE — Progress Notes (Signed)
[facility] History and Physical   Primary Care Physician:  Donnajean Lopes, MD   Reason for Procedure:   Family history of CRC  Plan:    colonoscopy     HPI: Olivia Cook is a 71 y.o. female  here for colonoscopy surveillance - sister with colon cancer dx age 73s. Patient's last exam was 05/2016. Patient denies any bowel symptoms at this time. Otherwise feels well without any cardiopulmonary symptoms.    Past Medical History:  Diagnosis Date   Allergy    SEASONAL   Anxiety    Arthritis    Emphysema of lung ([place])    Hyperlipidemia    Hypertension    Thyroid disease     Past Surgical History:  Procedure Laterality Date   COLONOSCOPY     POLYPECTOMY     VAGINAL HYSTERECTOMY N/A 1978    Prior to Admission medications   Medication Sig Start Date End Date Taking? Authorizing Provider  acidophilus (RISAQUAD) CAPS capsule Take by mouth daily. 1 probiotic tablet daily.   Yes [provider]  aspirin EC 81 MG tablet Take 81 mg by mouth daily.   Yes [provider]  atorvastatin (LIPITOR) 20 MG tablet Take 20 mg by mouth daily. 06/20/21  Yes [provider]  bisoprolol (ZEBETA) 5 MG tablet Take by mouth. 06/28/21  Yes [provider]  budesonide-formoterol (SYMBICORT) 160-4.5 MCG/ACT inhaler Inhale 2 puffs into the lungs 2 (two) times daily.   Yes [provider]  buPROPion (WELLBUTRIN SR) 150 MG 12 hr tablet Take 150 mg by mouth daily.   Yes [provider]  diltiazem (CARDIZEM LA) 120 MG 24 hr tablet Take 120 mg by mouth 2 (two) times daily.   Yes [provider]  levothyroxine (SYNTHROID) 100 MCG tablet Take by mouth. 05/30/21  Yes [provider]  losartan-hydrochlorothiazide (HYZAAR) 100-12.5 MG tablet Take 1 tablet by mouth daily.   Yes [provider]  acetaminophen (TYLENOL) 500 MG tablet Take 500 mg by mouth every 6 (six) hours as needed.    [provider]  OVER THE  COUNTER MEDICATION 1 tablet every morning.    [provider]    Current Outpatient Medications  Medication Sig Dispense Refill   acidophilus (RISAQUAD) CAPS capsule Take by mouth daily. 1 probiotic tablet daily.     aspirin EC 81 MG tablet Take 81 mg by mouth daily.     atorvastatin (LIPITOR) 20 MG tablet Take 20 mg by mouth daily.     bisoprolol (ZEBETA) 5 MG tablet Take by mouth.     budesonide-formoterol (SYMBICORT) 160-4.5 MCG/ACT inhaler Inhale 2 puffs into the lungs 2 (two) times daily.     buPROPion (WELLBUTRIN SR) 150 MG 12 hr tablet Take 150 mg by mouth daily.     diltiazem (CARDIZEM LA) 120 MG 24 hr tablet Take 120 mg by mouth 2 (two) times daily.     levothyroxine (SYNTHROID) 100 MCG tablet Take by mouth.     losartan-hydrochlorothiazide (HYZAAR) 100-12.5 MG tablet Take 1 tablet by mouth daily.     acetaminophen (TYLENOL) 500 MG tablet Take 500 mg by mouth every 6 (six) hours as needed.     OVER THE COUNTER MEDICATION 1 tablet every morning.     Current Facility-Administered Medications  Medication Dose Route Frequency Provider Last Rate Last Admin   0.9 %  sodium chloride infusion  500 mL Intravenous Continuous Mitzie Marlar, Carlota Raspberry, MD       0.9 %  sodium chloride infusion  500 mL Intravenous Once Nelsy Madonna, Carlota Raspberry, MD        Allergies as of 08/21/2021   (No Known Allergies)    Family History  Problem Relation Age of Onset   Colon cancer Sister        DX'D IN HER LATE 50'S   Esophageal cancer Neg Hx    Rectal cancer Neg Hx    Stomach cancer Neg Hx    Colon polyps Neg Hx     Social History   Socioeconomic History   Marital status: Widowed    Spouse name: Not on file   Number of children: Not on file   Years of education: Not on file   Highest education level: Not on file  Occupational History   Not on file  Tobacco Use   Smoking status: Some Days   Smokeless tobacco: Never   Tobacco comments:    SMOKES SOME DAYS, NOT DAILY   Vaping Use    Vaping Use: Never used  Substance and Sexual Activity   Alcohol use: No   Drug use: No   Sexual activity: Not on file  Other Topics Concern   Not on file  Social History Narrative   Not on file   Social Determinants of Health   Financial Resource Strain: Not on file  Food Insecurity: Not on file  Transportation Needs: Not on file  Physical Activity: Not on file  Stress: Not on file  Social Connections: Not on file  Intimate Partner Violence: Not on file    Review of Systems: All other review of systems negative except as mentioned in the HPI.  Physical Exam: Vital signs BP (!) 150/69   Pulse 73   Temp 98.2 F (36.8 C)   Ht '4\' 11"'$  (1.499 m)   Wt 139 lb (63 kg)   SpO2 97%   BMI 28.07 kg/m   General:   Alert,  Well-developed, pleasant and cooperative in NAD Lungs:  Clear throughout to auscultation.   Heart:  Regular rate and rhythm Abdomen:  Soft, nontender and nondistended.   Neuro/Psych:  Alert and cooperative. Normal mood and affect. A and O x 3  Jolly Mango, MD [facility]

## 2021-08-22 ENCOUNTER — Telehealth: Payer: Self-pay | Admitting: *Deleted

## 2021-08-22 NOTE — Telephone Encounter (Signed)
  Follow up Call-     08/21/2021    8:18 AM  Call back number  Post procedure Call Back phone  # [phone] hm  Permission to leave phone message Yes     Patient questions:  Do you have a fever, pain , or abdominal swelling? No. Pain Score  0 *  Have you tolerated food without any problems? Yes.    Have you been able to return to your normal activities? Yes.    Do you have any questions about your discharge instructions: Diet   No. Medications  No. Follow up visit  No.  Do you have questions or concerns about your Care? No.  Actions: * If pain score is 4 or above: No action needed, pain <4.

## 2021-08-23 ENCOUNTER — Encounter: Payer: Self-pay | Admitting: Gastroenterology

## 2021-08-28 DIAGNOSIS — E039 Hypothyroidism, unspecified: Secondary | ICD-10-CM | POA: Diagnosis not present

## 2021-08-28 DIAGNOSIS — M1990 Unspecified osteoarthritis, unspecified site: Secondary | ICD-10-CM | POA: Diagnosis not present

## 2021-08-28 DIAGNOSIS — F3342 Major depressive disorder, recurrent, in full remission: Secondary | ICD-10-CM | POA: Diagnosis not present

## 2021-08-28 DIAGNOSIS — M8580 Other specified disorders of bone density and structure, unspecified site: Secondary | ICD-10-CM | POA: Diagnosis not present

## 2021-08-28 DIAGNOSIS — I129 Hypertensive chronic kidney disease with stage 1 through stage 4 chronic kidney disease, or unspecified chronic kidney disease: Secondary | ICD-10-CM | POA: Diagnosis not present

## 2021-08-28 DIAGNOSIS — E785 Hyperlipidemia, unspecified: Secondary | ICD-10-CM | POA: Diagnosis not present

## 2021-08-28 DIAGNOSIS — J439 Emphysema, unspecified: Secondary | ICD-10-CM | POA: Diagnosis not present

## 2021-08-28 DIAGNOSIS — E669 Obesity, unspecified: Secondary | ICD-10-CM | POA: Diagnosis not present

## 2021-08-28 DIAGNOSIS — I739 Peripheral vascular disease, unspecified: Secondary | ICD-10-CM | POA: Diagnosis not present

## 2021-12-19 DIAGNOSIS — I10 Essential (primary) hypertension: Secondary | ICD-10-CM | POA: Diagnosis not present

## 2021-12-19 DIAGNOSIS — J439 Emphysema, unspecified: Secondary | ICD-10-CM | POA: Diagnosis not present

## 2021-12-19 DIAGNOSIS — R7301 Impaired fasting glucose: Secondary | ICD-10-CM | POA: Diagnosis not present

## 2021-12-19 DIAGNOSIS — I700 Atherosclerosis of aorta: Secondary | ICD-10-CM | POA: Diagnosis not present

## 2021-12-19 DIAGNOSIS — F5101 Primary insomnia: Secondary | ICD-10-CM | POA: Diagnosis not present

## 2021-12-19 DIAGNOSIS — Z23 Encounter for immunization: Secondary | ICD-10-CM | POA: Diagnosis not present

## 2021-12-19 DIAGNOSIS — I2510 Atherosclerotic heart disease of native coronary artery without angina pectoris: Secondary | ICD-10-CM | POA: Diagnosis not present

## 2021-12-19 DIAGNOSIS — I739 Peripheral vascular disease, unspecified: Secondary | ICD-10-CM | POA: Diagnosis not present

## 2021-12-19 DIAGNOSIS — E039 Hypothyroidism, unspecified: Secondary | ICD-10-CM | POA: Diagnosis not present

## 2022-02-05 DIAGNOSIS — Z1231 Encounter for screening mammogram for malignant neoplasm of breast: Secondary | ICD-10-CM | POA: Diagnosis not present

## 2022-02-25 DIAGNOSIS — Z1152 Encounter for screening for COVID-19: Secondary | ICD-10-CM | POA: Diagnosis not present

## 2022-02-25 DIAGNOSIS — I10 Essential (primary) hypertension: Secondary | ICD-10-CM | POA: Diagnosis not present

## 2022-02-25 DIAGNOSIS — R531 Weakness: Secondary | ICD-10-CM | POA: Diagnosis not present

## 2022-02-25 DIAGNOSIS — I739 Peripheral vascular disease, unspecified: Secondary | ICD-10-CM | POA: Diagnosis not present

## 2022-02-25 DIAGNOSIS — U071 COVID-19: Secondary | ICD-10-CM | POA: Diagnosis not present

## 2022-02-25 DIAGNOSIS — R0981 Nasal congestion: Secondary | ICD-10-CM | POA: Diagnosis not present

## 2022-02-25 DIAGNOSIS — R051 Acute cough: Secondary | ICD-10-CM | POA: Diagnosis not present

## 2022-04-08 ENCOUNTER — Other Ambulatory Visit: Payer: Self-pay | Admitting: Urology

## 2022-04-08 DIAGNOSIS — N281 Cyst of kidney, acquired: Secondary | ICD-10-CM

## 2022-05-11 ENCOUNTER — Ambulatory Visit
Admission: RE | Admit: 2022-05-11 | Discharge: 2022-05-11 | Disposition: A | Discharge status: Home / Self Care | Payer: Medicare PPO | Source: Ambulatory Visit | Attending: Urology | Admitting: Urology

## 2022-05-11 DIAGNOSIS — N281 Cyst of kidney, acquired: Secondary | ICD-10-CM

## 2022-05-11 MED ORDER — GADOPICLENOL 0.5 MMOL/ML IV SOLN
7.0000 mL | Freq: Once | INTRAVENOUS | Status: AC | PRN
  Administered 2022-05-11: 7 mL via INTRAVENOUS

## 2022-08-19 DIAGNOSIS — I10 Essential (primary) hypertension: Secondary | ICD-10-CM | POA: Diagnosis not present

## 2022-08-19 DIAGNOSIS — R7301 Impaired fasting glucose: Secondary | ICD-10-CM | POA: Diagnosis not present

## 2022-08-19 DIAGNOSIS — E782 Mixed hyperlipidemia: Secondary | ICD-10-CM | POA: Diagnosis not present

## 2022-08-19 DIAGNOSIS — E039 Hypothyroidism, unspecified: Secondary | ICD-10-CM | POA: Diagnosis not present

## 2022-08-26 DIAGNOSIS — J439 Emphysema, unspecified: Secondary | ICD-10-CM | POA: Diagnosis not present

## 2022-08-26 DIAGNOSIS — I700 Atherosclerosis of aorta: Secondary | ICD-10-CM | POA: Diagnosis not present

## 2022-08-26 DIAGNOSIS — E782 Mixed hyperlipidemia: Secondary | ICD-10-CM | POA: Diagnosis not present

## 2022-08-26 DIAGNOSIS — I2510 Atherosclerotic heart disease of native coronary artery without angina pectoris: Secondary | ICD-10-CM | POA: Diagnosis not present

## 2022-08-26 DIAGNOSIS — R82998 Other abnormal findings in urine: Secondary | ICD-10-CM | POA: Diagnosis not present

## 2022-08-26 DIAGNOSIS — Z720 Tobacco use: Secondary | ICD-10-CM | POA: Diagnosis not present

## 2022-08-26 DIAGNOSIS — Z1331 Encounter for screening for depression: Secondary | ICD-10-CM | POA: Diagnosis not present

## 2022-08-26 DIAGNOSIS — Z0000 Encounter for general adult medical examination without abnormal findings: Secondary | ICD-10-CM | POA: Diagnosis not present

## 2022-08-26 DIAGNOSIS — E039 Hypothyroidism, unspecified: Secondary | ICD-10-CM | POA: Diagnosis not present

## 2022-08-26 DIAGNOSIS — I10 Essential (primary) hypertension: Secondary | ICD-10-CM | POA: Diagnosis not present

## 2022-08-26 DIAGNOSIS — Z1339 Encounter for screening examination for other mental health and behavioral disorders: Secondary | ICD-10-CM | POA: Diagnosis not present

## 2023-02-09 DIAGNOSIS — Z1231 Encounter for screening mammogram for malignant neoplasm of breast: Secondary | ICD-10-CM | POA: Diagnosis not present

## 2023-03-02 DIAGNOSIS — R7301 Impaired fasting glucose: Secondary | ICD-10-CM | POA: Diagnosis not present

## 2023-03-02 DIAGNOSIS — I10 Essential (primary) hypertension: Secondary | ICD-10-CM | POA: Diagnosis not present

## 2023-03-02 DIAGNOSIS — Z720 Tobacco use: Secondary | ICD-10-CM | POA: Diagnosis not present

## 2023-03-02 DIAGNOSIS — R809 Proteinuria, unspecified: Secondary | ICD-10-CM | POA: Diagnosis not present

## 2023-03-02 DIAGNOSIS — J439 Emphysema, unspecified: Secondary | ICD-10-CM | POA: Diagnosis not present

## 2023-03-19 DIAGNOSIS — I10 Essential (primary) hypertension: Secondary | ICD-10-CM | POA: Diagnosis not present

## 2023-03-19 DIAGNOSIS — J439 Emphysema, unspecified: Secondary | ICD-10-CM | POA: Diagnosis not present

## 2023-03-19 DIAGNOSIS — Z720 Tobacco use: Secondary | ICD-10-CM | POA: Diagnosis not present

## 2023-03-19 DIAGNOSIS — R809 Proteinuria, unspecified: Secondary | ICD-10-CM | POA: Diagnosis not present

## 2023-03-19 DIAGNOSIS — J209 Acute bronchitis, unspecified: Secondary | ICD-10-CM | POA: Diagnosis not present

## 2023-03-19 DIAGNOSIS — R0602 Shortness of breath: Secondary | ICD-10-CM | POA: Diagnosis not present

## 2023-03-31 ENCOUNTER — Other Ambulatory Visit: Payer: Self-pay | Admitting: Family Medicine

## 2023-03-31 DIAGNOSIS — Z720 Tobacco use: Secondary | ICD-10-CM | POA: Diagnosis not present

## 2023-03-31 DIAGNOSIS — R809 Proteinuria, unspecified: Secondary | ICD-10-CM | POA: Diagnosis not present

## 2023-03-31 DIAGNOSIS — J209 Acute bronchitis, unspecified: Secondary | ICD-10-CM | POA: Diagnosis not present

## 2023-03-31 DIAGNOSIS — I2510 Atherosclerotic heart disease of native coronary artery without angina pectoris: Secondary | ICD-10-CM | POA: Diagnosis not present

## 2023-03-31 DIAGNOSIS — I10 Essential (primary) hypertension: Secondary | ICD-10-CM | POA: Diagnosis not present

## 2023-03-31 DIAGNOSIS — J439 Emphysema, unspecified: Secondary | ICD-10-CM | POA: Diagnosis not present

## 2023-03-31 DIAGNOSIS — R0602 Shortness of breath: Secondary | ICD-10-CM | POA: Diagnosis not present

## 2023-03-31 DIAGNOSIS — R918 Other nonspecific abnormal finding of lung field: Secondary | ICD-10-CM | POA: Diagnosis not present

## 2023-04-01 ENCOUNTER — Ambulatory Visit
Admission: RE | Admit: 2023-04-01 | Discharge: 2023-04-01 | Discharge status: Home / Self Care | Payer: Medicare PPO | Source: Ambulatory Visit | Attending: Family Medicine | Admitting: Family Medicine

## 2023-04-01 DIAGNOSIS — R911 Solitary pulmonary nodule: Secondary | ICD-10-CM | POA: Diagnosis not present

## 2023-04-01 DIAGNOSIS — R0602 Shortness of breath: Secondary | ICD-10-CM

## 2023-04-01 DIAGNOSIS — I700 Atherosclerosis of aorta: Secondary | ICD-10-CM | POA: Diagnosis not present

## 2023-04-01 DIAGNOSIS — J189 Pneumonia, unspecified organism: Secondary | ICD-10-CM | POA: Diagnosis not present

## 2023-04-14 DIAGNOSIS — I10 Essential (primary) hypertension: Secondary | ICD-10-CM | POA: Diagnosis not present

## 2023-04-14 DIAGNOSIS — Z720 Tobacco use: Secondary | ICD-10-CM | POA: Diagnosis not present

## 2023-04-14 DIAGNOSIS — J439 Emphysema, unspecified: Secondary | ICD-10-CM | POA: Diagnosis not present

## 2023-04-14 DIAGNOSIS — I2510 Atherosclerotic heart disease of native coronary artery without angina pectoris: Secondary | ICD-10-CM | POA: Diagnosis not present

## 2023-04-14 DIAGNOSIS — R918 Other nonspecific abnormal finding of lung field: Secondary | ICD-10-CM | POA: Diagnosis not present

## 2023-04-14 DIAGNOSIS — R0602 Shortness of breath: Secondary | ICD-10-CM | POA: Diagnosis not present

## 2023-04-14 DIAGNOSIS — J189 Pneumonia, unspecified organism: Secondary | ICD-10-CM | POA: Diagnosis not present

## 2023-05-06 DIAGNOSIS — R911 Solitary pulmonary nodule: Secondary | ICD-10-CM | POA: Diagnosis not present

## 2023-05-06 DIAGNOSIS — J069 Acute upper respiratory infection, unspecified: Secondary | ICD-10-CM | POA: Diagnosis not present

## 2023-05-06 DIAGNOSIS — J449 Chronic obstructive pulmonary disease, unspecified: Secondary | ICD-10-CM | POA: Diagnosis not present

## 2023-05-06 DIAGNOSIS — F17200 Nicotine dependence, unspecified, uncomplicated: Secondary | ICD-10-CM | POA: Diagnosis not present

## 2023-05-06 DIAGNOSIS — J4540 Moderate persistent asthma, uncomplicated: Secondary | ICD-10-CM | POA: Diagnosis not present

## 2023-05-12 DIAGNOSIS — R918 Other nonspecific abnormal finding of lung field: Secondary | ICD-10-CM | POA: Diagnosis not present

## 2023-05-12 DIAGNOSIS — I10 Essential (primary) hypertension: Secondary | ICD-10-CM | POA: Diagnosis not present

## 2023-05-12 DIAGNOSIS — Z720 Tobacco use: Secondary | ICD-10-CM | POA: Diagnosis not present

## 2023-05-12 DIAGNOSIS — R0602 Shortness of breath: Secondary | ICD-10-CM | POA: Diagnosis not present

## 2023-05-12 DIAGNOSIS — I2510 Atherosclerotic heart disease of native coronary artery without angina pectoris: Secondary | ICD-10-CM | POA: Diagnosis not present

## 2023-05-12 DIAGNOSIS — J439 Emphysema, unspecified: Secondary | ICD-10-CM | POA: Diagnosis not present

## 2023-07-14 NOTE — Progress Notes (Unsigned)
 Cardiology Office Note:    Date:  07/15/2023   ID:  DARION JUHASZ, DOB 01/11/1951, MRN [id number]  PCP:  Bertha Broad, MD  Cardiologist:  None  Electrophysiologist:  None   Referring MD: Bertha Broad, MD   Chief Complaint  Patient presents with   Shortness of Breath    History of Present Illness:    HEIDIE KRALL is a 73 y.o. female with a hx of COPD, hypertension, hyperlipidemia, hypothyroidism who is referred by Dr. Efraim Grange for evaluation of CAD.  On CT chest 04/01/2023 noted to have severe  three-vessel coronary calcifications.  Denies any chest pain, lightheadedness, syncope, lower extremity edema, or palpitations.  She does report she has been having shortness of breath, especially with going up and down stairs.  Reports pain in legs with walking upstairs.  She had some lower extremity edema when started diltiazem but has improved.  She denies any lightheadedness or syncope.  She smoked for 50 years, about 0.5 packs/day at peak but now down to 2 to 3 cigarettes/day.  Family history includes father CABG in 57s and mother had CHF.   Past Medical History:  Diagnosis Date   Allergy    SEASONAL   Anxiety    Arthritis    Emphysema of lung ([facility])    Hyperlipidemia    Hypertension    Thyroid disease     Past Surgical History:  Procedure Laterality Date   COLONOSCOPY     POLYPECTOMY     VAGINAL HYSTERECTOMY N/A 1978    Current Medications: Current Meds  Medication Sig   acetaminophen (TYLENOL) 500 MG tablet Take 500 mg by mouth every 6 (six) hours as needed.   amLODipine (NORVASC) 5 MG tablet Take 1 tablet (5 mg total) by mouth daily.   aspirin EC 81 MG tablet Take 81 mg by mouth daily.   atorvastatin (LIPITOR) 20 MG tablet Take 20 mg by mouth daily.   bisoprolol (ZEBETA) 5 MG tablet Take by mouth.   budesonide-formoterol (SYMBICORT) 160-4.5 MCG/ACT inhaler Inhale 2 puffs into the lungs 2 (two) times daily.   buPROPion (WELLBUTRIN SR) 150 MG 12 hr tablet Take 150  mg by mouth daily.   cyclobenzaprine (FLEXERIL) 10 MG tablet Take 10 mg by mouth at bedtime.   hydrochlorothiazide (HYDRODIURIL) 25 MG tablet Take 25 mg by mouth daily.   levothyroxine (SYNTHROID) 100 MCG tablet Take by mouth.   olmesartan (BENICAR) 40 MG tablet Take 40 mg by mouth daily.   [DISCONTINUED] diltiazem (CARDIZEM LA) 120 MG 24 hr tablet Take 120 mg by mouth 2 (two) times daily.     Allergies:   Patient has no known allergies.   Social History   Socioeconomic History   Marital status: Widowed    Spouse name: Not on file   Number of children: Not on file   Years of education: Not on file   Highest education level: Not on file  Occupational History   Not on file  Tobacco Use   Smoking status: Some Days   Smokeless tobacco: Never   Tobacco comments:    SMOKES SOME DAYS, NOT DAILY   Vaping Use   Vaping status: Never Used  Substance and Sexual Activity   Alcohol  use: No   Drug use: No   Sexual activity: Not on file  Other Topics Concern   Not on file  Social History Narrative   Not on file   Social Drivers of Health   Financial Resource Strain: Not  on file  Food Insecurity: Low Risk  (05/06/2023)   Received from [facility]   Hunger Vital Sign    Worried About Running Out of Food in the Last Year: Never true    Ran Out of Food in the Last Year: Never true  Transportation Needs: No Transportation Needs (05/06/2023)   Received from [facility]    In the past 12 months, has lack of reliable transportation kept you from medical appointments, meetings, work or from getting things needed for daily living? : No  Physical Activity: Not on file  Stress: Not on file  Social Connections: Unknown (06/30/2021)   Received from [facility]   Social Network    Social Network: Not on file     Family History: The patient's family history includes Colon cancer in her sister. There is no history of Esophageal cancer, Rectal cancer, Stomach  cancer, or Colon polyps.  ROS:   Please see the history of present illness.     All other systems reviewed and are negative.  EKGs/Labs/Other Studies Reviewed:    The following studies were reviewed today:   EKG:   07/15/2023: Normal sinus rhythm, rate 69, less than 1 mm ST depressions in leads II, aVF and V4-6, nonspecific T wave flattening  Recent Labs: No results found for requested labs within last 365 days.  Recent Lipid Panel No results found for: "CHOL", "TRIG", "HDL", "CHOLHDL", "VLDL", "LDLCALC", "LDLDIRECT"  Physical Exam:    VS:  BP (!) 150/70 (BP Location: Left Arm, Patient Position: Sitting, Cuff Size: Normal)   Pulse 69   Ht 4\' 10"  (1.473 m)   Wt 151 lb 3.2 oz (68.6 kg)   SpO2 94%   BMI 31.60 kg/m     Wt Readings from Last 3 Encounters:  07/15/23 151 lb 3.2 oz (68.6 kg)  08/21/21 139 lb (63 kg)  07/31/21 139 lb (63 kg)     GEN:  Well nourished, well developed in no acute distress HEENT: Normal NECK: No JVD; right carotid bruit CARDIAC: RRR, no murmurs, rubs, gallops RESPIRATORY:  mild wheezing ABDOMEN: Soft, non-tender, non-distended MUSCULOSKELETAL:  No edema; No deformity  SKIN: Warm and dry NEUROLOGIC:  Alert and oriented x 3 PSYCHIATRIC:  Normal affect   ASSESSMENT:    1. Shortness of breath   2. Coronary artery disease involving native coronary artery of native heart, unspecified whether angina present   3. HYPERTENSION   4. Bilateral leg pain   5. Hyperlipidemia, unspecified hyperlipidemia type    PLAN:    CAD: Severe multivessel coronary calcifications noted on CT chest 03/2023.  She denies chest pain but is reporting dyspnea on exertion that could represent anginal equivalent - Continue aspirin, statin.  Check lipid panel - Recommend stress PET to evaluate for ischemia.  She is not a treadmill candidate - Check echocardiogram to evaluate for structural heart disease  Leg pain: She is reporting exertional leg discomfort, will evaluate  for PAD with ABIs  Hypertension: On bisoprolol 5 mg twice daily, HCTZ 25 mg daily, olmesartan 40 mg daily, diltiazem 120 mg twice daily.  BP elevated in clinic today, switch diltiazem to amlodipine 5 mg daily.  Asked to check BP daily for next 2 weeks and let us  know results.  Hyperlipidemia: On atorvastatin 20 mg daily.  LDL 79 on 08/19/22.  Update lipid panel, if LDL remains above goal less than 70 will increase atorvastatin  Carotid stenosis: Mild (1-39% stenosis) in bilateral carotid arteries  on duplex 10/2018.  Will monitor  Tobacco use: Counseled on risk of tobacco use and cessation strongly encouraged  RTC in 3 months  Informed Consent   Shared Decision Making/Informed Consent The risks [chest pain, shortness of breath, cardiac arrhythmias, dizziness, blood pressure fluctuations, myocardial infarction, stroke/transient ischemic attack, nausea, vomiting, allergic reaction, radiation exposure, metallic taste sensation and life-threatening complications (estimated to be 1 in 10,000)], benefits (risk stratification, diagnosing coronary artery disease, treatment guidance) and alternatives of a cardiac PET stress test were discussed in detail with Ms. Monier and she agrees to proceed.      Medication Adjustments/Labs and Tests Ordered: Current medicines are reviewed at length with the patient today.  Concerns regarding medicines are outlined above.  Orders Placed This Encounter  Procedures   NM PET CT CARDIAC PERFUSION MULTI W/ABSOLUTE BLOODFLOW   Comprehensive Metabolic Panel (CMET)   Magnesium   Lipid panel   CBC w/Diff/Platelet   EKG 12-Lead   ECHOCARDIOGRAM COMPLETE   VAS US  ABI WITH/WO TBI   Meds ordered this encounter  Medications   amLODipine (NORVASC) 5 MG tablet    Sig: Take 1 tablet (5 mg total) by mouth daily.    Dispense:  90 tablet    Refill:  3    Patient Instructions  Medication Instructions:  Start Amlodipine 5mg  daily Stop Diltiazem as discussed with your  provider *If you need a refill on your cardiac medications before your next appointment, please call your pharmacy*  Lab Work: Cmet, lipid panel, mg, cbc today If you have labs (blood work) drawn today and your tests are completely normal, you will receive your results only by: MyChart Message (if you have MyChart) OR A paper copy in the mail If you have any lab test that is abnormal or we need to change your treatment, we will call you to review the results.  Testing/Procedures: Echo Your physician has requested that you have an echocardiogram. Echocardiography is a painless test that uses sound waves to create images of your heart. It provides your doctor with information about the size and shape of your heart and how well your heart's chambers and valves are working. This procedure takes approximately one hour. There are no restrictions for this procedure. Please do NOT wear cologne, perfume, aftershave, or lotions (deodorant is allowed). Please arrive 15 minutes prior to your appointment time.  Please note: We ask at that you not bring children with you during ultrasound (echo/ vascular) testing. Due to room size and safety concerns, children are not allowed in the ultrasound rooms during exams. Our front office staff cannot provide observation of children in our lobby area while testing is being conducted. An adult accompanying a patient to their appointment will only be allowed in the ultrasound room at the discretion of the ultrasound technician under special circumstances. We apologize for any inconvenience.    AND  Your physician has requested that you have an ankle brachial index (ABI). During this test an ultrasound and blood pressure cuff are used to evaluate the arteries that supply the arms and legs with blood. Allow thirty minutes for this exam. There are no restrictions or special instructions. This will take place at [address] 4th floor   Please note: We ask at that  you not bring children with you during ultrasound (echo/ vascular) testing. Due to room size and safety concerns, children are not allowed in the ultrasound rooms during exams. Our front office staff cannot provide observation of children in our  lobby area while testing is being conducted. An adult accompanying a patient to their appointment will only be allowed in the ultrasound room at the discretion of the ultrasound technician under special circumstances. We apologize for any inconvenience.  Follow-Up: At [facility], you and your health needs are our priority.  As part of our continuing mission to provide you with exceptional heart care, our providers are all part of one team.  This team includes your primary Cardiologist (physician) and Advanced Practice Providers or APPs (Physician Assistants and Nurse Practitioners) who all work together to provide you with the care you need, when you need it.  Your next appointment:   3 month(s)  Provider:   Dr. Alda Amas We recommend signing up for the patient portal called "MyChart".  Sign up information is provided on this After Visit Summary.  MyChart is used to connect with patients for Virtual Visits (Telemedicine).  Patients are able to view lab/test results, encounter notes, upcoming appointments, etc.  Non-urgent messages can be sent to your provider as well.   To learn more about what you can do with MyChart, go to [email or web address].   Other Instructions    Please report to Radiology at the [facility] Main Entrance 30 minutes early for your test.  [address] [place], [place] [address]                          How to Prepare for Your Cardiac PET/CT Stress Test:  Nothing to eat or drink, except water, 3 hours prior to arrival time.  NO caffeine/decaffeinated products, or chocolate 12 hours prior to arrival. (Please note decaffeinated beverages (teas/coffees) still contain caffeine).  If you have caffeine within  12 hours prior, the test will need to be rescheduled.  Medication instructions: Do not take nitrates (isosorbide mononitrate, Ranexa) the day before or day of test Do not take tamsulosin the day before or morning of test Hold theophylline containing medications for 12 hours. Hold Dipyridamole 48 hours prior to the test.  Diabetic Preparation: If able to eat breakfast prior to 3 hour fasting, you may take all medications, including your insulin. Do not worry if you miss your breakfast dose of insulin - start at your next meal. If you do not eat prior to 3 hour fast-Hold all diabetes (oral and insulin) medications. Patients who wear a continuous glucose monitor MUST remove the device prior to scanning.  You may take your remaining medications with water.  NO perfume, cologne or lotion on chest or abdomen area. FEMALES - Please avoid wearing dresses to this appointment.  Total time is 1 to 2 hours; you may want to bring reading material for the waiting time.  IF YOU THINK YOU MAY BE PREGNANT, OR ARE NURSING PLEASE INFORM THE TECHNOLOGIST.  In preparation for your appointment, medication and supplies will be purchased.  Appointment availability is limited, so if you need to cancel or reschedule, please call the Radiology Department Scheduler at [phone] 24 hours in advance to avoid a cancellation fee of $100.00  What to Expect When you Arrive:  Once you arrive and check in for your appointment, you will be taken to a preparation room within the Radiology Department.  A technologist or Nurse will obtain your medical history, verify that you are correctly prepped for the exam, and explain the procedure.  Afterwards, an IV will be started in your arm and electrodes will be placed on your skin  for EKG monitoring during the stress portion of the exam. Then you will be escorted to the PET/CT scanner.  There, staff will get you positioned on the scanner and obtain a blood pressure and EKG.   During the exam, you will continue to be connected to the EKG and blood pressure machines.  A small, safe amount of a radioactive tracer will be injected in your IV to obtain a series of pictures of your heart along with an injection of a stress agent.    After your Exam:  It is recommended that you eat a meal and drink a caffeinated beverage to counter act any effects of the stress agent.  Drink plenty of fluids for the remainder of the day and urinate frequently for the first couple of hours after the exam.  Your doctor will inform you of your test results within 7-10 business days.  For more information and frequently asked questions, please visit our website: [email or web address]  For questions about your test or how to prepare for your test, please call: Cardiac Imaging Nurse Navigators Office: [phone]        Signed, Wendie Hamburg, MD  07/15/2023 5:35 PM    [facility]

## 2023-07-15 ENCOUNTER — Encounter: Payer: Self-pay | Admitting: Cardiology

## 2023-07-15 ENCOUNTER — Ambulatory Visit: Attending: Cardiology | Admitting: Cardiology

## 2023-07-15 VITALS — BP 150/70 | HR 69 | Ht <= 58 in | Wt 151.2 lb

## 2023-07-15 DIAGNOSIS — M79605 Pain in left leg: Secondary | ICD-10-CM

## 2023-07-15 DIAGNOSIS — R0602 Shortness of breath: Secondary | ICD-10-CM | POA: Diagnosis not present

## 2023-07-15 DIAGNOSIS — I2510 Atherosclerotic heart disease of native coronary artery without angina pectoris: Secondary | ICD-10-CM

## 2023-07-15 DIAGNOSIS — E785 Hyperlipidemia, unspecified: Secondary | ICD-10-CM | POA: Diagnosis not present

## 2023-07-15 DIAGNOSIS — M79604 Pain in right leg: Secondary | ICD-10-CM

## 2023-07-15 DIAGNOSIS — I10 Essential (primary) hypertension: Secondary | ICD-10-CM

## 2023-07-15 LAB — LIPID PANEL

## 2023-07-15 MED ORDER — AMLODIPINE BESYLATE 5 MG PO TABS: 5.0000 mg | ORAL_TABLET | Freq: Every day | ORAL | 3 refills | Status: DC

## 2023-07-15 NOTE — Patient Instructions (Addendum)
 Medication Instructions:  Start Amlodipine 5mg  daily Stop Diltiazem as discussed with your provider *If you need a refill on your cardiac medications before your next appointment, please call your pharmacy*  Lab Work: Cmet, lipid panel, mg, cbc today If you have labs (blood work) drawn today and your tests are completely normal, you will receive your results only by: MyChart Message (if you have MyChart) OR A paper copy in the mail If you have any lab test that is abnormal or we need to change your treatment, we will call you to review the results.  Testing/Procedures: Echo Your physician has requested that you have an echocardiogram. Echocardiography is a painless test that uses sound waves to create images of your heart. It provides your doctor with information about the size and shape of your heart and how well your heart's chambers and valves are working. This procedure takes approximately one hour. There are no restrictions for this procedure. Please do NOT wear cologne, perfume, aftershave, or lotions (deodorant is allowed). Please arrive 15 minutes prior to your appointment time.  Please note: We ask at that you not bring children with you during ultrasound (echo/ vascular) testing. Due to room size and safety concerns, children are not allowed in the ultrasound rooms during exams. Our front office staff cannot provide observation of children in our lobby area while testing is being conducted. An adult accompanying a patient to their appointment will only be allowed in the ultrasound room at the discretion of the ultrasound technician under special circumstances. We apologize for any inconvenience.    AND  Your physician has requested that you have an ankle brachial index (ABI). During this test an ultrasound and blood pressure cuff are used to evaluate the arteries that supply the arms and legs with blood. Allow thirty minutes for this exam. There are no restrictions or special  instructions. This will take place at [address] 4th floor   Please note: We ask at that you not bring children with you during ultrasound (echo/ vascular) testing. Due to room size and safety concerns, children are not allowed in the ultrasound rooms during exams. Our front office staff cannot provide observation of children in our lobby area while testing is being conducted. An adult accompanying a patient to their appointment will only be allowed in the ultrasound room at the discretion of the ultrasound technician under special circumstances. We apologize for any inconvenience.  Follow-Up: At [facility], you and your health needs are our priority.  As part of our continuing mission to provide you with exceptional heart care, our providers are all part of one team.  This team includes your primary Cardiologist (physician) and Advanced Practice Providers or APPs (Physician Assistants and Nurse Practitioners) who all work together to provide you with the care you need, when you need it.  Your next appointment:   3 month(s)  Provider:   Dr. Alda Amas We recommend signing up for the patient portal called "MyChart".  Sign up information is provided on this After Visit Summary.  MyChart is used to connect with patients for Virtual Visits (Telemedicine).  Patients are able to view lab/test results, encounter notes, upcoming appointments, etc.  Non-urgent messages can be sent to your provider as well.   To learn more about what you can do with MyChart, go to [email or web address].   Other Instructions    Please report to Radiology at the [facility] Main Entrance 30 minutes early for your test.  [address] [facility]  [address]  [place], [place] [address]                          How to Prepare for Your Cardiac PET/CT Stress Test:  Nothing to eat or drink, except water, 3 hours prior to arrival time.  NO caffeine/decaffeinated products, or chocolate 12 hours prior to arrival. (Please  note decaffeinated beverages (teas/coffees) still contain caffeine).  If you have caffeine within 12 hours prior, the test will need to be rescheduled.  Medication instructions: Do not take nitrates (isosorbide mononitrate, Ranexa) the day before or day of test Do not take tamsulosin the day before or morning of test Hold theophylline containing medications for 12 hours. Hold Dipyridamole 48 hours prior to the test.  Diabetic Preparation: If able to eat breakfast prior to 3 hour fasting, you may take all medications, including your insulin. Do not worry if you miss your breakfast dose of insulin - start at your next meal. If you do not eat prior to 3 hour fast-Hold all diabetes (oral and insulin) medications. Patients who wear a continuous glucose monitor MUST remove the device prior to scanning.  You may take your remaining medications with water.  NO perfume, cologne or lotion on chest or abdomen area. FEMALES - Please avoid wearing dresses to this appointment.  Total time is 1 to 2 hours; you may want to bring reading material for the waiting time.  IF YOU THINK YOU MAY BE PREGNANT, OR ARE NURSING PLEASE INFORM THE TECHNOLOGIST.  In preparation for your appointment, medication and supplies will be purchased.  Appointment availability is limited, so if you need to cancel or reschedule, please call the Radiology Department Scheduler at [phone] 24 hours in advance to avoid a cancellation fee of $100.00  What to Expect When you Arrive:  Once you arrive and check in for your appointment, you will be taken to a preparation room within the Radiology Department.  A technologist or Nurse will obtain your medical history, verify that you are correctly prepped for the exam, and explain the procedure.  Afterwards, an IV will be started in your arm and electrodes will be placed on your skin for EKG monitoring during the stress portion of the exam. Then you will be escorted to the PET/CT  scanner.  There, staff will get you positioned on the scanner and obtain a blood pressure and EKG.  During the exam, you will continue to be connected to the EKG and blood pressure machines.  A small, safe amount of a radioactive tracer will be injected in your IV to obtain a series of pictures of your heart along with an injection of a stress agent.    After your Exam:  It is recommended that you eat a meal and drink a caffeinated beverage to counter act any effects of the stress agent.  Drink plenty of fluids for the remainder of the day and urinate frequently for the first couple of hours after the exam.  Your doctor will inform you of your test results within 7-10 business days.  For more information and frequently asked questions, please visit our website: [email or web address]  For questions about your test or how to prepare for your test, please call: Cardiac Imaging Nurse Navigators Office: [phone]

## 2023-07-16 LAB — CBC WITH DIFFERENTIAL/PLATELET
Basophils Absolute: 0.1 10*3/uL (ref 0.0–0.2)
Basos: 1 %
EOS (ABSOLUTE): 0 10*3/uL (ref 0.0–0.4)
Eos: 0 %
Hematocrit: 39.5 % (ref 34.0–46.6)
Hemoglobin: 12.7 g/dL (ref 11.1–15.9)
Immature Grans (Abs): 0.1 10*3/uL (ref 0.0–0.1)
Immature Granulocytes: 1 %
Lymphocytes Absolute: 2.8 10*3/uL (ref 0.7–3.1)
Lymphs: 26 %
MCH: 29.8 pg (ref 26.6–33.0)
MCHC: 32.2 g/dL (ref 31.5–35.7)
MCV: 93 fL (ref 79–97)
Monocytes Absolute: 0.6 10*3/uL (ref 0.1–0.9)
Monocytes: 6 %
Neutrophils Absolute: 7.2 10*3/uL — ABNORMAL HIGH (ref 1.4–7.0)
Neutrophils: 66 %
Platelets: 310 10*3/uL (ref 150–450)
RBC: 4.26 x10E6/uL (ref 3.77–5.28)
RDW: 14.4 % (ref 11.7–15.4)
WBC: 10.7 10*3/uL (ref 3.4–10.8)

## 2023-07-16 LAB — COMPREHENSIVE METABOLIC PANEL WITH GFR
ALT: 14 IU/L (ref 0–32)
AST: 17 IU/L (ref 0–40)
Albumin: 3.9 g/dL (ref 3.8–4.8)
Alkaline Phosphatase: 131 IU/L — ABNORMAL HIGH (ref 44–121)
BUN/Creatinine Ratio: 31 — ABNORMAL HIGH (ref 12–28)
BUN: 33 mg/dL — ABNORMAL HIGH (ref 8–27)
CO2: 17 mmol/L — ABNORMAL LOW (ref 20–29)
Calcium: 9.3 mg/dL (ref 8.7–10.3)
Chloride: 107 mmol/L — ABNORMAL HIGH (ref 96–106)
Creatinine, Ser: 1.08 mg/dL — ABNORMAL HIGH (ref 0.57–1.00)
Globulin, Total: 2.6 g/dL (ref 1.5–4.5)
Glucose: 122 mg/dL — ABNORMAL HIGH (ref 70–99)
Potassium: 4.5 mmol/L (ref 3.5–5.2)
Sodium: 144 mmol/L (ref 134–144)
Total Protein: 6.5 g/dL (ref 6.0–8.5)
eGFR: 54 mL/min/{1.73_m2} — ABNORMAL LOW (ref >59)

## 2023-07-16 LAB — LIPID PANEL
Cholesterol, Total: 196 mg/dL (ref 100–199)
HDL: 40 mg/dL (ref >39)
LDL CALC COMMENT:: 4.9 ratio — ABNORMAL HIGH (ref 0.0–4.4)
LDL Chol Calc (NIH): 74 mg/dL (ref 0–99)
Triglycerides: 522 mg/dL — ABNORMAL HIGH (ref 0–149)
VLDL Cholesterol Cal: 82 mg/dL — ABNORMAL HIGH (ref 5–40)

## 2023-07-16 LAB — MAGNESIUM: Magnesium: 1.6 mg/dL (ref 1.6–2.3)

## 2023-07-17 ENCOUNTER — Ambulatory Visit: Payer: Self-pay | Admitting: Cardiology

## 2023-07-17 DIAGNOSIS — I739 Peripheral vascular disease, unspecified: Secondary | ICD-10-CM

## 2023-07-17 DIAGNOSIS — I2510 Atherosclerotic heart disease of native coronary artery without angina pectoris: Secondary | ICD-10-CM

## 2023-07-17 DIAGNOSIS — E785 Hyperlipidemia, unspecified: Secondary | ICD-10-CM

## 2023-07-17 DIAGNOSIS — I10 Essential (primary) hypertension: Secondary | ICD-10-CM

## 2023-07-21 ENCOUNTER — Other Ambulatory Visit: Payer: Self-pay | Admitting: *Deleted

## 2023-07-21 DIAGNOSIS — E785 Hyperlipidemia, unspecified: Secondary | ICD-10-CM

## 2023-07-21 DIAGNOSIS — I10 Essential (primary) hypertension: Secondary | ICD-10-CM

## 2023-07-21 MED ORDER — MAGNESIUM OXIDE -MG SUPPLEMENT 400 MG PO CAPS: 400.0000 mg | ORAL_CAPSULE | Freq: Every day | ORAL | 3 refills | Status: AC

## 2023-07-21 NOTE — Progress Notes (Signed)
 Called and left message with Dr. Alda Amas recommendation to start Magnesium Oxide 400 mg daily and repeat lipid panel, Mg, BMET. Left message for patient to call office for any questions.

## 2023-07-29 DIAGNOSIS — I10 Essential (primary) hypertension: Secondary | ICD-10-CM | POA: Diagnosis not present

## 2023-07-29 DIAGNOSIS — I2510 Atherosclerotic heart disease of native coronary artery without angina pectoris: Secondary | ICD-10-CM | POA: Diagnosis not present

## 2023-07-29 DIAGNOSIS — E785 Hyperlipidemia, unspecified: Secondary | ICD-10-CM | POA: Diagnosis not present

## 2023-07-30 LAB — BASIC METABOLIC PANEL WITH GFR
BUN/Creatinine Ratio: 27 (ref 12–28)
BUN: 32 mg/dL — ABNORMAL HIGH (ref 8–27)
CO2: 15 mmol/L — ABNORMAL LOW (ref 20–29)
Calcium: 9.3 mg/dL (ref 8.7–10.3)
Chloride: 110 mmol/L — ABNORMAL HIGH (ref 96–106)
Creatinine, Ser: 1.17 mg/dL — ABNORMAL HIGH (ref 0.57–1.00)
Glucose: 94 mg/dL (ref 70–99)
Potassium: 5.7 mmol/L — ABNORMAL HIGH (ref 3.5–5.2)
Sodium: 146 mmol/L — ABNORMAL HIGH (ref 134–144)
eGFR: 49 mL/min/{1.73_m2} — ABNORMAL LOW (ref >59)

## 2023-07-30 LAB — LIPID PANEL
Chol/HDL Ratio: 4.5 ratio — ABNORMAL HIGH (ref 0.0–4.4)
Cholesterol, Total: 178 mg/dL (ref 100–199)
HDL: 40 mg/dL (ref >39)
LDL Chol Calc (NIH): 94 mg/dL (ref 0–99)
Triglycerides: 258 mg/dL — ABNORMAL HIGH (ref 0–149)
VLDL Cholesterol Cal: 44 mg/dL — ABNORMAL HIGH (ref 5–40)

## 2023-07-30 LAB — MAGNESIUM: Magnesium: 2 mg/dL (ref 1.6–2.3)

## 2023-07-30 NOTE — Telephone Encounter (Signed)
 Called and left patient message that potassium was elevated and per Dr. Alda Amas request BMET today or tomorrow. Mild bump in creatinine and elevated sodium, in addition potassium is high. Per Dr. Alda Amas may be slightly dehydrated with fasting for labs. Had normal potassium 2 weeks ago. Left message to avoid foods high in potassium and to stay hydrated. Orders placed for BMET. Left message to call office for any questions.

## 2023-07-30 NOTE — Telephone Encounter (Signed)
 Pt returning call

## 2023-07-31 ENCOUNTER — Other Ambulatory Visit: Payer: Self-pay

## 2023-07-31 DIAGNOSIS — I10 Essential (primary) hypertension: Secondary | ICD-10-CM | POA: Diagnosis not present

## 2023-07-31 DIAGNOSIS — I2510 Atherosclerotic heart disease of native coronary artery without angina pectoris: Secondary | ICD-10-CM

## 2023-07-31 DIAGNOSIS — E785 Hyperlipidemia, unspecified: Secondary | ICD-10-CM

## 2023-08-04 LAB — LIPID PANEL
Chol/HDL Ratio: 4.5 ratio — ABNORMAL HIGH (ref 0.0–4.4)
Cholesterol, Total: 190 mg/dL (ref 100–199)
HDL: 42 mg/dL (ref >39)
LDL Chol Calc (NIH): 92 mg/dL (ref 0–99)
Triglycerides: 335 mg/dL — ABNORMAL HIGH (ref 0–149)
VLDL Cholesterol Cal: 56 mg/dL — ABNORMAL HIGH (ref 5–40)

## 2023-08-04 LAB — BASIC METABOLIC PANEL WITH GFR
BUN/Creatinine Ratio: 26 (ref 12–28)
BUN: 33 mg/dL — ABNORMAL HIGH (ref 8–27)
CO2: 19 mmol/L — ABNORMAL LOW (ref 20–29)
Calcium: 9.3 mg/dL (ref 8.7–10.3)
Chloride: 108 mmol/L — ABNORMAL HIGH (ref 96–106)
Creatinine, Ser: 1.25 mg/dL — ABNORMAL HIGH (ref 0.57–1.00)
Glucose: 81 mg/dL (ref 70–99)
Potassium: 5.1 mmol/L (ref 3.5–5.2)
Sodium: 143 mmol/L (ref 134–144)
eGFR: 46 mL/min/{1.73_m2} — ABNORMAL LOW (ref >59)

## 2023-08-04 LAB — MAGNESIUM: Magnesium: 2.1 mg/dL (ref 1.6–2.3)

## 2023-08-05 NOTE — Telephone Encounter (Signed)
 Called patient and unable to reach patient left message to call office to update medication list.

## 2023-08-07 DIAGNOSIS — I10 Essential (primary) hypertension: Secondary | ICD-10-CM | POA: Diagnosis not present

## 2023-08-08 LAB — BASIC METABOLIC PANEL WITH GFR
BUN/Creatinine Ratio: 30 — ABNORMAL HIGH (ref 12–28)
BUN: 39 mg/dL — ABNORMAL HIGH (ref 8–27)
CO2: 18 mmol/L — ABNORMAL LOW (ref 20–29)
Calcium: 9.6 mg/dL (ref 8.7–10.3)
Chloride: 107 mmol/L — ABNORMAL HIGH (ref 96–106)
Creatinine, Ser: 1.31 mg/dL — ABNORMAL HIGH (ref 0.57–1.00)
Glucose: 95 mg/dL (ref 70–99)
Potassium: 5.1 mmol/L (ref 3.5–5.2)
Sodium: 142 mmol/L (ref 134–144)
eGFR: 43 mL/min/{1.73_m2} — ABNORMAL LOW (ref >59)

## 2023-08-12 ENCOUNTER — Other Ambulatory Visit: Payer: Self-pay | Admitting: *Deleted

## 2023-08-12 MED ORDER — HYDROCHLOROTHIAZIDE 12.5 MG PO CAPS: 12.5000 mg | ORAL_CAPSULE | Freq: Every day | ORAL | 3 refills | Status: DC

## 2023-08-12 MED ORDER — OLMESARTAN MEDOXOMIL 20 MG PO TABS: 20.0000 mg | ORAL_TABLET | Freq: Every day | ORAL | 3 refills | Status: DC

## 2023-08-12 MED ORDER — AMLODIPINE BESYLATE 10 MG PO TABS: 10.0000 mg | ORAL_TABLET | Freq: Every day | ORAL | 3 refills | Status: AC

## 2023-08-17 ENCOUNTER — Telehealth: Payer: Self-pay | Admitting: Cardiology

## 2023-08-17 DIAGNOSIS — I10 Essential (primary) hypertension: Secondary | ICD-10-CM

## 2023-08-17 NOTE — Telephone Encounter (Signed)
 Pt calling to report BP Readings:  6/16-152/73  6/18-155/75 6/19-162/67 6/20-124/68 6/21-130/53 6/22-136/70 6/23-160/67 6/24-143/58 6/25-134/64 6/26-144/68 6/27-148/68 6/28-131/56 6/29-163/70 6/30-149/75

## 2023-08-17 NOTE — Telephone Encounter (Signed)
 Called and spoke to pt regarding BP meds/symptoms. 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 Describes leg weakness as painful, but mostly feeling weak.   Current BP meds:  Amlodipine  10 mg every day  Bisoprolol 5 mg BID Olmesartan  20 mg every day Hydrochlorothiazide  12.5 mg every day

## 2023-08-17 NOTE — Telephone Encounter (Signed)
 Recommend scheduling in pharmacy hypertension clinic.  Would continue to check BP daily and bring BP log to clinic appointment, as well as bring home monitor to calibrate to appointment

## 2023-08-24 NOTE — Telephone Encounter (Signed)
 Called and left patient message per Dr. Kate request Referral to Pharmacy Hypertension Clinic. Left message that someone will call and set that appointment up. Left message for patient to check blood pressure and bring blood  pressure log and blood pressure home  monitor to that appointment to be calibrated. Left message to call office for any questions.

## 2023-08-26 DIAGNOSIS — R7301 Impaired fasting glucose: Secondary | ICD-10-CM | POA: Diagnosis not present

## 2023-08-26 DIAGNOSIS — Z1212 Encounter for screening for malignant neoplasm of rectum: Secondary | ICD-10-CM | POA: Diagnosis not present

## 2023-08-26 DIAGNOSIS — I10 Essential (primary) hypertension: Secondary | ICD-10-CM | POA: Diagnosis not present

## 2023-08-26 DIAGNOSIS — I2510 Atherosclerotic heart disease of native coronary artery without angina pectoris: Secondary | ICD-10-CM | POA: Diagnosis not present

## 2023-08-26 DIAGNOSIS — E782 Mixed hyperlipidemia: Secondary | ICD-10-CM | POA: Diagnosis not present

## 2023-08-26 DIAGNOSIS — E039 Hypothyroidism, unspecified: Secondary | ICD-10-CM | POA: Diagnosis not present

## 2023-08-31 ENCOUNTER — Ambulatory Visit: Attending: Cardiovascular Disease | Admitting: Pharmacist

## 2023-08-31 ENCOUNTER — Encounter: Payer: Self-pay | Admitting: Pharmacist

## 2023-08-31 VITALS — BP 124/67 | HR 81

## 2023-08-31 DIAGNOSIS — I10 Essential (primary) hypertension: Secondary | ICD-10-CM | POA: Diagnosis not present

## 2023-08-31 NOTE — Assessment & Plan Note (Signed)
 Assessment: BP is controlled in office BP 124/67 mmHg heart rate 81 (goal <130/80). Patient has been taking current BP meds regularly  and tolerating them well   Denies SOB, palpitation, chest pain, headaches,or swelling Educated on lifestyle intervention to lower BP   Home BP ~125-135/65-70. Need to validated home BP monitor   Plan:  Continue taking Bisoprolol 5 mg twice daily, olmesartan  20 mg daily hydrochlorothiazide  12.5 mg daily, amlodipine  10 mg daily  Patient to keep record of BP readings with heart rate and report to us  at the next visit Patient to bring BP monitor for validation at next OV  Patient to see PharmD in 8 weeks for follow up  Follow up lab(s):none

## 2023-08-31 NOTE — Progress Notes (Signed)
 Patient ID: TAI SYFERT                 DOB: [birth year]                      MRN: [id number]      HPI: Olivia Cook is a 73 y.o. female referred by Dr. Kate to HTN clinic. PMH is significant for COPD, hypertension, hyperlipidemia, hypothyroidism, On CT chest 04/01/2023 noted to have severe  three-vessel coronary calcifications,Mild (1-39% stenosis) in bilateral carotid arteries on duplex 10/2018.  Amlodipine  was started on 07/15/23 for elevated BP and later dose was increased to 10 mg. Other BP meds includes Bisoprolol 5 mg twice daily, olmesartan  20 mg daily hydrochlorothiazide  12.5 mg daily   The patient presented today for hypertension clinic follow-up. Over the past two weeks, she reports home blood pressure readings averaging 125-135/65-70 mmHg, taken at least once daily. However, her BP monitor has never been validated. She tolerates her current antihypertensive medications well without any side effects and denies headache, shortness of breath, chest pain, palpitations, or dizziness. She works as a Financial trader, is physically active during the week, and takes Wednesdays and weekends off. She lives with her 67 year old grandson. The patient adds salt to her food and is not aware of hidden sources of sodium. We had a detailed discussion about the impact of dietary sodium and regular exercise on blood pressure control. She was advised to consider reducing salt intake and ensuring her BP monitor is validated.  Current HTN meds: Bisoprolol 5 mg twice daily, olmesartan  20 mg daily hydrochlorothiazide  12.5 mg daily, amlodipine  10 mg daily  Previously tried: none  BP goal: <130/80   Family History:  elation Problem Comments  Mother (Deceased)   Father (Deceased)   Sister (Deceased) Colon cancer DX'D IN HER LATE 50'S     Social History:  Alcohol : none  Smoking: 2 cig per day - will be going to PCP this week to discuss the meds to quit smoking  Diet:  Coffee - 3 - 4 cups  Eats out - once a  week  When eats home eats healthy  Exercise: none but motivated to start regular walks with her grandson    Home BP readings: 125-135/65-70   Wt Readings from Last 3 Encounters:  07/15/23 151 lb 3.2 oz (68.6 kg)  08/21/21 139 lb (63 kg)  07/31/21 139 lb (63 kg)   BP Readings from Last 3 Encounters:  08/31/23 124/67  07/15/23 (!) 150/70  08/21/21 (!) 125/50   Pulse Readings from Last 3 Encounters:  08/31/23 81  07/15/23 69  08/21/21 68    Renal function: CrCl cannot be calculated (Patient's most recent lab result is older than the maximum 21 days allowed.).  Past Medical History:  Diagnosis Date   Allergy    SEASONAL   Anxiety    Arthritis    Emphysema of lung (HCC)    Hyperlipidemia    Hypertension    Thyroid disease     Current Outpatient Medications on File Prior to Visit  Medication Sig Dispense Refill   acetaminophen (TYLENOL) 500 MG tablet Take 500 mg by mouth every 6 (six) hours as needed.     amLODipine  (NORVASC ) 10 MG tablet Take 1 tablet (10 mg total) by mouth daily. 90 tablet 3   aspirin EC 81 MG tablet Take 81 mg by mouth daily.     atorvastatin (LIPITOR) 20 MG tablet Take 20 mg by mouth daily.  bisoprolol (ZEBETA) 5 MG tablet Take by mouth.     budesonide-formoterol (SYMBICORT) 160-4.5 MCG/ACT inhaler Inhale 2 puffs into the lungs 2 (two) times daily.     buPROPion (WELLBUTRIN SR) 150 MG 12 hr tablet Take 150 mg by mouth daily.     cyclobenzaprine (FLEXERIL) 10 MG tablet Take 10 mg by mouth at bedtime.     hydrochlorothiazide  (MICROZIDE ) 12.5 MG capsule Take 1 capsule (12.5 mg total) by mouth daily. 90 capsule 3   levothyroxine (SYNTHROID) 100 MCG tablet Take by mouth.     Magnesium  Oxide -Mg Supplement 400 MG CAPS Take 400 mg by mouth daily. 90 capsule 3   olmesartan  (BENICAR ) 20 MG tablet Take 1 tablet (20 mg total) by mouth daily. 90 tablet 3   OVER THE COUNTER MEDICATION 1 tablet every morning.     No current facility-administered medications  on file prior to visit.    No Known Allergies  Blood pressure 124/67, pulse 81, SpO2 97%.   Assessment/Plan:  1. Hypertension -  HYPERTENSION Assessment: BP is controlled in office BP 124/67 mmHg heart rate 81 (goal <130/80). Patient has been taking current BP meds regularly  and tolerating them well   Denies SOB, palpitation, chest pain, headaches,or swelling Educated on lifestyle intervention to lower BP   Home BP ~125-135/65-70. Need to validated home BP monitor   Plan:  Continue taking Bisoprolol 5 mg twice daily, olmesartan  20 mg daily hydrochlorothiazide  12.5 mg daily, amlodipine  10 mg daily  Patient to keep record of BP readings with heart rate and report to us  at the next visit Patient to bring BP monitor for validation at next OV  Patient to see PharmD in 8 weeks for follow up  Follow up lab(s):none    Thank you  Robbi Blanch, Pharm.D [facility]. [facility] [address], [place], [place] [address] Phone: [phone]; Fax: [phone]

## 2023-08-31 NOTE — Patient Instructions (Addendum)
 Changes made by your pharmacist Robbi Blanch, PharmD at today's visit:    Instructions/Changes  (what do you need to do) Your Notes  (what you did and when you did it)  Continue taking Bisoprolol 5 mg twice daily, olmesartan  20 mg daily hydrochlorothiazide  12.5 mg daily, amlodipine  10 mg daily    Lower salt intake and look at sodium content on prepackage food      Bring all of your meds, your BP cuff and your record of home blood pressures to your next appointment.    HOW TO TAKE YOUR BLOOD PRESSURE AT HOME  Rest 5 minutes before taking your blood pressure.  Don't smoke or drink caffeinated beverages for at least 30 minutes before. Take your blood pressure before (not after) you eat. Sit comfortably with your back supported and both feet on the floor (don't cross your legs). Elevate your arm to heart level on a table or a desk. Use the proper sized cuff. It should fit smoothly and snugly around your bare upper arm. There should be enough room to slip a fingertip under the cuff. The bottom edge of the cuff should be 1 inch above the crease of the elbow. Ideally, take 3 measurements at one sitting and record the average.  Important lifestyle changes to control high blood pressure  Intervention  Effect on the BP  Lose extra pounds and watch your waistline Weight loss is one of the most effective lifestyle changes for controlling blood pressure. If you're overweight or obese, losing even a small amount of weight can help reduce blood pressure. Blood pressure might go down by about 1 millimeter of mercury (mm Hg) with each kilogram (about 2.2 pounds) of weight lost.  Exercise regularly As a general goal, aim for at least 30 minutes of moderate physical activity every day. Regular physical activity can lower high blood pressure by about 5 to 8 mm Hg.  Eat a healthy diet Eating a diet rich in whole grains, fruits, vegetables, and low-fat dairy products and low in saturated fat and  cholesterol. A healthy diet can lower high blood pressure by up to 11 mm Hg.  Reduce salt (sodium) in your diet Even a small reduction of sodium in the diet can improve heart health and reduce high blood pressure by about 5 to 6 mm Hg.  Limit alcohol  One drink equals 12 ounces of beer, 5 ounces of wine, or 1.5 ounces of 80-proof liquor.  Limiting alcohol  to less than one drink a day for women or two drinks a day for men can help lower blood pressure by about 4 mm Hg.   If you have any questions or concerns please use My Chart to send questions or call the office at [phone]

## 2023-09-03 DIAGNOSIS — E782 Mixed hyperlipidemia: Secondary | ICD-10-CM | POA: Diagnosis not present

## 2023-09-03 DIAGNOSIS — Z0000 Encounter for general adult medical examination without abnormal findings: Secondary | ICD-10-CM | POA: Diagnosis not present

## 2023-09-03 DIAGNOSIS — J439 Emphysema, unspecified: Secondary | ICD-10-CM | POA: Diagnosis not present

## 2023-09-03 DIAGNOSIS — I10 Essential (primary) hypertension: Secondary | ICD-10-CM | POA: Diagnosis not present

## 2023-09-03 DIAGNOSIS — I739 Peripheral vascular disease, unspecified: Secondary | ICD-10-CM | POA: Diagnosis not present

## 2023-09-03 DIAGNOSIS — R82998 Other abnormal findings in urine: Secondary | ICD-10-CM | POA: Diagnosis not present

## 2023-09-03 DIAGNOSIS — R7301 Impaired fasting glucose: Secondary | ICD-10-CM | POA: Diagnosis not present

## 2023-09-03 DIAGNOSIS — I2510 Atherosclerotic heart disease of native coronary artery without angina pectoris: Secondary | ICD-10-CM | POA: Diagnosis not present

## 2023-09-03 DIAGNOSIS — E039 Hypothyroidism, unspecified: Secondary | ICD-10-CM | POA: Diagnosis not present

## 2023-09-03 DIAGNOSIS — Z720 Tobacco use: Secondary | ICD-10-CM | POA: Diagnosis not present

## 2023-09-07 ENCOUNTER — Ambulatory Visit (HOSPITAL_COMMUNITY)
Admission: RE | Admit: 2023-09-07 | Discharge: 2023-09-07 | Disposition: A | Discharge status: Home / Self Care | Source: Ambulatory Visit | Attending: Cardiology | Admitting: Cardiology

## 2023-09-07 ENCOUNTER — Ambulatory Visit (HOSPITAL_BASED_OUTPATIENT_CLINIC_OR_DEPARTMENT_OTHER)
Admission: RE | Admit: 2023-09-07 | Discharge: 2023-09-07 | Disposition: A | Discharge status: Home / Self Care | Source: Ambulatory Visit | Attending: Cardiology

## 2023-09-07 DIAGNOSIS — M79605 Pain in left leg: Secondary | ICD-10-CM | POA: Diagnosis not present

## 2023-09-07 DIAGNOSIS — R0602 Shortness of breath: Secondary | ICD-10-CM | POA: Diagnosis not present

## 2023-09-07 DIAGNOSIS — M79604 Pain in right leg: Secondary | ICD-10-CM | POA: Insufficient documentation

## 2023-09-07 LAB — ECHOCARDIOGRAM COMPLETE
AR max vel: 2.7 cm2
AV Area VTI: 2.43 cm2
AV Area mean vel: 2.55 cm2
AV Mean grad: 2 mmHg
AV Peak grad: 4.4 mmHg
Ao pk vel: 1.05 m/s
Area-P 1/2: 3.65 cm2
S' Lateral: 2.44 cm

## 2023-09-08 LAB — VAS US ABI WITH/WO TBI
Left ABI: 0.52
Right ABI: 0.42

## 2023-09-09 ENCOUNTER — Telehealth: Payer: Self-pay | Admitting: *Deleted

## 2023-09-09 NOTE — Telephone Encounter (Signed)
 Called and made patient aware of Dr. Kate recomendations

## 2023-09-09 NOTE — Telephone Encounter (Signed)
 Called and made patient aware per Dr.Schumann recommended significantly reduced ABIs in both legs, recommend bilateral LE arterial duplex for further evaluation of PAD and referral to Dr Court or Dr Darron. Order placed and patient made patient aware that someone will reach out to get those visits scheduled. Patient verbalized an understanding.

## 2023-09-14 ENCOUNTER — Other Ambulatory Visit: Payer: Self-pay | Admitting: *Deleted

## 2023-09-14 DIAGNOSIS — I739 Peripheral vascular disease, unspecified: Secondary | ICD-10-CM

## 2023-09-14 NOTE — Progress Notes (Signed)
 Order placed for referral for Dr. Darron or Dr. Court for peripheral vascular consult for per Dr. Kate. Patient will be made aware. Olivia Cook

## 2023-09-16 ENCOUNTER — Ambulatory Visit (HOSPITAL_COMMUNITY)
Admission: RE | Admit: 2023-09-16 | Discharge: 2023-09-16 | Disposition: A | Discharge status: Home / Self Care | Source: Ambulatory Visit | Attending: Cardiology | Admitting: Cardiology

## 2023-09-16 DIAGNOSIS — I739 Peripheral vascular disease, unspecified: Secondary | ICD-10-CM | POA: Insufficient documentation

## 2023-09-17 ENCOUNTER — Telehealth: Payer: Self-pay | Admitting: *Deleted

## 2023-09-17 ENCOUNTER — Telehealth: Payer: Self-pay | Admitting: Cardiology

## 2023-09-17 DIAGNOSIS — I10 Essential (primary) hypertension: Secondary | ICD-10-CM

## 2023-09-17 DIAGNOSIS — I7409 Other arterial embolism and thrombosis of abdominal aorta: Secondary | ICD-10-CM

## 2023-09-17 NOTE — Telephone Encounter (Signed)
 Patient returned RN's call.

## 2023-09-17 NOTE — Telephone Encounter (Signed)
 Called and made patient aware of results and Dr. Kate recommendations. Made patient aware that order had been placed for CTA of abdomen and pelvis runoff and and BMET needed to be done before procedure. Understanding verbalized

## 2023-09-17 NOTE — Telephone Encounter (Signed)
 Called and made patient aware  of results and that Dr. 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 Kate recommend CTA of the abdomen and pelvis with runoff to evaluate for aortoiliac occlusive disease. Made patient aware that Dr. Kate would like BMET  prior to CT. Orders placed. Understanding verbalized.

## 2023-09-23 ENCOUNTER — Other Ambulatory Visit: Payer: Self-pay | Admitting: *Deleted

## 2023-09-23 DIAGNOSIS — I10 Essential (primary) hypertension: Secondary | ICD-10-CM | POA: Diagnosis not present

## 2023-09-23 DIAGNOSIS — L821 Other seborrheic keratosis: Secondary | ICD-10-CM | POA: Diagnosis not present

## 2023-09-23 DIAGNOSIS — D485 Neoplasm of uncertain behavior of skin: Secondary | ICD-10-CM | POA: Diagnosis not present

## 2023-09-24 ENCOUNTER — Ambulatory Visit: Payer: Self-pay | Admitting: Cardiology

## 2023-09-24 LAB — BASIC METABOLIC PANEL WITH GFR
BUN/Creatinine Ratio: 20 (ref 12–28)
BUN: 30 mg/dL — ABNORMAL HIGH (ref 8–27)
CO2: 18 mmol/L — ABNORMAL LOW (ref 20–29)
Calcium: 9.3 mg/dL (ref 8.7–10.3)
Chloride: 110 mmol/L — ABNORMAL HIGH (ref 96–106)
Creatinine, Ser: 1.52 mg/dL — ABNORMAL HIGH (ref 0.57–1.00)
Glucose: 100 mg/dL — ABNORMAL HIGH (ref 70–99)
Potassium: 5.3 mmol/L — ABNORMAL HIGH (ref 3.5–5.2)
Sodium: 144 mmol/L (ref 134–144)
eGFR: 36 mL/min/1.73 — ABNORMAL LOW (ref >59)

## 2023-09-29 ENCOUNTER — Encounter (HOSPITAL_COMMUNITY): Payer: Self-pay | Admitting: *Deleted

## 2023-09-29 ENCOUNTER — Encounter: Payer: Self-pay | Admitting: Cardiovascular Disease

## 2023-09-29 ENCOUNTER — Ambulatory Visit: Attending: Cardiovascular Disease | Admitting: Cardiovascular Disease

## 2023-09-29 VITALS — BP 136/56 | HR 85 | Ht <= 58 in | Wt 148.0 lb

## 2023-09-29 DIAGNOSIS — I739 Peripheral vascular disease, unspecified: Secondary | ICD-10-CM | POA: Insufficient documentation

## 2023-09-29 NOTE — Patient Instructions (Signed)
 Medication Instructions:  Your physician recommends that you continue on your current medications as directed. Please refer to the Current Medication list given to you today.  *If you need a refill on your cardiac medications before your next appointment, please call your pharmacy*   Follow-Up: At [facility], you and your health needs are our priority.  As part of our continuing mission to provide you with exceptional heart care, our providers are all part of one team.  This team includes your primary Cardiologist (physician) and Advanced Practice Providers or APPs (Physician Assistants and Nurse Practitioners) who all work together to provide you with the care you need, when you need it.  Your next appointment:   After CT run-off   Provider:   Dorn Lesches, MD    We recommend signing up for the patient portal called MyChart.  Sign up information is provided on this After Visit Summary.  MyChart is used to connect with patients for Virtual Visits (Telemedicine).  Patients are able to view lab/test results, encounter notes, upcoming appointments, etc.  Non-urgent messages can be sent to your provider as well.   To learn more about what you can do with MyChart, go to [email or web address].

## 2023-09-29 NOTE — Assessment & Plan Note (Signed)
 Olivia Cook was referred to me by Dr. Kate for evaluation of PAD.  She does have multiple cardiac risk factors.  She says that over the last several months she is noticed weakness and tiredness in her legs when she walks upstairs or walks any distance.  This is symmetric.  Her Doppler studies reveal ABIs in the 0.4-0.5 range bilaterally with monophasic waveforms.  I suspect she has a more proximal lesion either Leriche syndrome or bilateral iliac disease.  She is scheduled for CT angiogram in the near future.  We will make final recommendations based on those results.

## 2023-09-29 NOTE — Progress Notes (Signed)
 09/29/2023 Olivia Cook   [birth year]  [id number]  Primary Physician Yolande Toribio MATSU, MD Primary Cardiologist: Dorn JINNY Lesches MD GENI CODY MADEIRA, FSCAI  HPI:  Olivia Cook is a 73 y.o. 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  She is referred by her cardiologist, Dr. Kate, for evaluation of PAD.  She does live with her 62 year old grandson.  Risk factors include family history of heart disease with father who had bypass surgery in [place]  back in the late 70s/early 80s.  She has treated hypertension and hyperlipidemia.  She does smoke but in low quantities.  She denies dyspnea but denies chest pain.  She has had claudication bilaterally for several months which is symmetric more noticeable when walking up stairs or walking any distances.  She had lower extremity arterial Doppler studies performed 09/16/2023 revealing a right ABI of 0.42, left of 0.52 with monophasic waveforms throughout both lower extremities suggesting a more proximal lesion.   Current Meds  Medication Sig   acetaminophen (TYLENOL) 500 MG tablet Take 500 mg by mouth every 6 (six) hours as needed.   amLODipine  (NORVASC ) 10 MG tablet Take 1 tablet (10 mg total) by mouth daily.   aspirin EC 81 MG tablet Take 81 mg by mouth daily.   atorvastatin (LIPITOR) 20 MG tablet Take 20 mg by mouth daily.   bisoprolol (ZEBETA) 5 MG tablet Take by mouth. (Patient taking differently: Take 5 mg by mouth 2 (two) times daily.)   budesonide-formoterol (SYMBICORT) 160-4.5 MCG/ACT inhaler Inhale 2 puffs into the lungs 2 (two) times daily.   buPROPion (WELLBUTRIN SR) 150 MG 12 hr tablet Take 150 mg by mouth daily.   cyclobenzaprine (FLEXERIL) 10 MG tablet Take 10 mg by mouth at bedtime.   hydrochlorothiazide  (MICROZIDE ) 12.5 MG capsule Take 1 capsule (12.5 mg total) by mouth daily.    levothyroxine (SYNTHROID) 100 MCG tablet Take by mouth.   Magnesium  Oxide -Mg Supplement 400 MG CAPS Take 400 mg by mouth daily.   olmesartan  (BENICAR ) 20 MG tablet Take 1 tablet (20 mg total) by mouth daily.     No Known Allergies  Social History   Socioeconomic History   Marital status: Widowed    Spouse name: Not on file   Number of children: Not on file   Years of education: Not on file   Highest education level: Not on file  Occupational History   Not on file  Tobacco Use   Smoking status: Some Days   Smokeless tobacco: Never   Tobacco comments:    SMOKES SOME DAYS, NOT DAILY   Vaping Use   Vaping status: Never Used  Substance and Sexual Activity   Alcohol  use: No   Drug use: No   Sexual activity: Not on file  Other Topics Concern   Not on file  Social History Narrative   Not on file   Social Drivers of Health   Financial Resource Strain: Not on file  Food Insecurity: Low Risk  (05/06/2023)   Received from [facility]   Hunger Vital Sign    Within the past 12 months, you worried that your food would run out before you got money to buy more: Never true    Within the past 12 months, the food you bought just didn't last and you didn't have money to get more. : Never true  Transportation Needs: No Transportation Needs (05/06/2023)   Received from [facility]    In the past 12 months, has lack of reliable transportation kept you from medical appointments, meetings, work or from getting things needed for daily living? : No  Physical Activity: Not on file  Stress: Not on file  Social Connections: Unknown (06/30/2021)   Received from [facility]   Social Network    Social Network: Not on file  Intimate Partner Violence: Unknown (05/22/2021)   Received from [facility]   HITS    Physically Hurt: Not on file    Insult or Talk Down To: Not on file    Threaten Physical Harm: Not on file    Scream or Curse: Not on file     Review of  Systems: General: negative for chills, fever, night sweats or weight changes.  Cardiovascular: negative for chest pain, dyspnea on exertion, edema, orthopnea, palpitations, paroxysmal nocturnal dyspnea or shortness of breath Dermatological: negative for rash Respiratory: negative for cough or wheezing Urologic: negative for hematuria Abdominal: negative for nausea, vomiting, diarrhea, bright red blood per rectum, melena, or hematemesis Neurologic: negative for visual changes, syncope, or dizziness All other systems reviewed and are otherwise negative except as noted above.    Blood pressure (!) 136/56, pulse 85, height 4' 10 (1.473 m), weight 148 lb (67.1 kg), SpO2 95%.  General appearance: alert and no distress Neck: no adenopathy, no JVD, supple, symmetrical, trachea midline, thyroid  not enlarged, symmetric, no tenderness/mass/nodules, and soft left carotid bruit Lungs: clear to auscultation bilaterally Heart: regular rate and rhythm, S1, S2 normal, no murmur, click, rub or gallop Extremities: extremities normal, atraumatic, no cyanosis or edema Pulses: Diminished pedal pulses Skin: Skin color, texture, turgor normal. No rashes or lesions Neurologic: Grossly normal  EKG not performed today      ASSESSMENT AND PLAN:   Peripheral arterial disease (HCC) Ms. Hagadorn was referred to me by Dr. Kate for evaluation of PAD.  She does have multiple cardiac risk factors.  She says that over the last several months she is noticed weakness and tiredness in her legs when she walks upstairs or walks any distance.  This is symmetric.  Her Doppler studies reveal ABIs in the 0.4-0.5 range bilaterally with monophasic waveforms.  I suspect she has a more proximal lesion either Leriche syndrome or bilateral iliac disease.  She is scheduled for CT angiogram in the near future.  We will make final recommendations based on those results.     Dorn DOROTHA Lesches MD FACP,FACC,FAHA, [facility] 09/29/2023 8:41  AM

## 2023-09-29 NOTE — Telephone Encounter (Signed)
 Instructions sent for upcoming stress test via [employer].  Claudene Ronal Quale, RN

## 2023-10-05 ENCOUNTER — Other Ambulatory Visit: Payer: Self-pay | Admitting: Cardiology

## 2023-10-05 DIAGNOSIS — R0602 Shortness of breath: Secondary | ICD-10-CM

## 2023-10-06 NOTE — Progress Notes (Unsigned)
 Patient ID: Olivia Cook                 DOB: 14-Aug-1950                      MRN: [id number]      HPI: Olivia Cook is a 73 y.o. female referred by Dr. Kate to HTN clinic. PMH is significant for COPD, hypertension, hyperlipidemia, hypothyroidism, On CT chest 04/01/2023 noted to have severe  three-vessel coronary calcifications,Mild (1-39% stenosis) in bilateral carotid arteries on duplex 10/2018.  Amlodipine  was started on 07/15/23 for elevated BP and later dose was increased to 10 mg. Other BP meds includes Bisoprolol 5 mg twice daily, olmesartan  20 mg daily hydrochlorothiazide  12.5 mg daily at last visit with me no meds changed as her OV BP was at goal and pt reported her home BP was in [id number] range too. Pt were advised to bring BP monitor at the next visit with home BP log.  The patient presented today for a blood pressure follow-up visit. She reported that she forgot to bring her home blood pressure monitor and log. At her last visit, her BMP showed elevated BUN and serum creatinine, and she was advised to discontinue hydrochlorothiazide ; however, she has continued taking it. Earlier this morning, the patient underwent a perfusion test and presented to the clinic afterward. She reports experiencing a moderate headache, which she believes is related to the medications given prior to the test. She has also been experiencing muscle cramps for the past few weeks. Her PCP prescribed Flexeril, which has provided partial relief. In addition, the patient began drinking pickle juice in an attempt to manage the cramps. She was educated on the high sodium content of pickle juice and advised to stop using it. The patient has been taking Lipitor (atorvastatin) 20 mg;  recommended reducing the dose to 10 mg and increasing her magnesium  supplementation to see if this helps alleviate the muscle cramps.  Current HTN meds: Bisoprolol 5 mg twice daily, olmesartan  20 mg daily hydrochlorothiazide  12.5 mg daily,  amlodipine  10 mg daily  Previously tried: none  BP goal: <130/80   Family History:  elation Problem Comments  Mother (Deceased)   Father (Deceased)   Sister (Deceased) Colon cancer DX'D IN HER LATE 50'S     Social History:  Alcohol : none  Smoking: 2 cig per day - will be going to PCP this week to discuss the meds to quit smoking  Diet:  Coffee - 3 - 4 cups  Eats out - once a week  When eats home eats healthy   Home BP readings:none    Wt Readings from Last 3 Encounters:  09/29/23 148 lb (67.1 kg)  07/15/23 151 lb 3.2 oz (68.6 kg)  08/21/21 139 lb (63 kg)   BP Readings from Last 3 Encounters:  10/07/23 (!) 148/72  09/29/23 (!) 136/56  08/31/23 124/67   Pulse Readings from Last 3 Encounters:  10/07/23 75  09/29/23 85  08/31/23 81    Renal function: Estimated Creatinine Clearance: 26.7 mL/min (A) (by C-G formula based on SCr of 1.52 mg/dL (H)).  Past Medical History:  Diagnosis Date   Allergy    SEASONAL   Anxiety    Arthritis    Emphysema of lung (HCC)    Hyperlipidemia    Hypertension    Thyroid  disease     Current Outpatient Medications on File Prior to Visit  Medication Sig Dispense Refill   acetaminophen (TYLENOL)  500 MG tablet Take 500 mg by mouth every 6 (six) hours as needed.     amLODipine  (NORVASC ) 10 MG tablet Take 1 tablet (10 mg total) by mouth daily. 90 tablet 3   aspirin EC 81 MG tablet Take 81 mg by mouth daily.     atorvastatin (LIPITOR) 20 MG tablet Take 20 mg by mouth daily.     bisoprolol (ZEBETA) 5 MG tablet Take by mouth. (Patient taking differently: Take 5 mg by mouth 2 (two) times daily.)     budesonide-formoterol (SYMBICORT) 160-4.5 MCG/ACT inhaler Inhale 2 puffs into the lungs 2 (two) times daily.     buPROPion (WELLBUTRIN SR) 150 MG 12 hr tablet Take 150 mg by mouth daily.     cyclobenzaprine (FLEXERIL) 10 MG tablet Take 10 mg by mouth at bedtime.     hydrochlorothiazide  (MICROZIDE ) 12.5 MG capsule Take 1 capsule (12.5 mg total)  by mouth daily. 90 capsule 3   levothyroxine (SYNTHROID) 100 MCG tablet Take by mouth.     Magnesium  Oxide -Mg Supplement 400 MG CAPS Take 400 mg by mouth daily. 90 capsule 3   olmesartan  (BENICAR ) 20 MG tablet Take 1 tablet (20 mg total) by mouth daily. 90 tablet 3   OVER THE COUNTER MEDICATION 1 tablet every morning.     No current facility-administered medications on file prior to visit.    No Known Allergies  Blood pressure (!) 148/72, pulse 75, SpO2 96%.   Assessment/Plan:  1. Hypertension -  HYPERTENSION Assessment and Plan:  BP is uncontrolled in office BP 156/72 mmHg 2nd reading 148/72 heart rate 75 (goal <130/80). Patient has been taking current BP meds regularly  and tolerating them well   Denies SOB, palpitation, chest pain, headaches,or swelling Forgot to bring home BP log and BP monitor doesn't recall home BP readings  From last few weeks taking pickle juice which is affecting BP. last BMP early Aug showed elevation in BUN & SrCr  pt was advised to stopped hydrochlorothiazide  however, she has continued taking it by mistake  Will get updated BMP today, encouraged patient to drink at least 6-8 glasses of water per day  Continue taking Bisoprolol 5 mg twice daily, olmesartan  20 mg daily hydrochlorothiazide  12.5 mg daily, amlodipine  10 mg daily  Patient to keep record of BP readings with heart rate and report to us  at the next visit Patient to bring BP monitor for validation at next OV  Pt to stop drinking pickle juice and stay vigilant with overall sodium intake Pt will be seeing Dr.Schumann on Sept 2  Patient to see PharmD in 4 weeks for follow up  Follow up lab(s):BMP today or tomorrow if SrCr elevated will hold HCTZ    HYPERLIPIDEMIA Assessment and Plan:  LDL goal <70 mg/dl. Last LDLc 92 mg/dl (93/86/74) while on Lipitor 20 gm daily. Lately having muscle cramps. Flexeril has provided partial relief. Advised to reduce Lipitor dose to 10 mg daily to see it would help  with muscle cramps. Will assess the tolerability at next OV in 4 weeks if needed we will optimize lipid therapy     Thank you  Zilda No, Pharm.D [place] [facility]. [facility] [address], [place], [place] [address] Phone: [phone]; Fax: [phone]

## 2023-10-07 ENCOUNTER — Ambulatory Visit (HOSPITAL_COMMUNITY)
Admission: RE | Admit: 2023-10-07 | Discharge: 2023-10-07 | Disposition: A | Discharge status: Home / Self Care | Source: Ambulatory Visit | Attending: Cardiovascular Disease | Admitting: Cardiovascular Disease

## 2023-10-07 ENCOUNTER — Other Ambulatory Visit (HOSPITAL_COMMUNITY)

## 2023-10-07 ENCOUNTER — Ambulatory Visit: Attending: Cardiology | Admitting: Pharmacist

## 2023-10-07 ENCOUNTER — Encounter: Payer: Self-pay | Admitting: Pharmacist

## 2023-10-07 VITALS — BP 148/72 | HR 75

## 2023-10-07 DIAGNOSIS — R0602 Shortness of breath: Secondary | ICD-10-CM | POA: Diagnosis not present

## 2023-10-07 DIAGNOSIS — I10 Essential (primary) hypertension: Secondary | ICD-10-CM | POA: Diagnosis not present

## 2023-10-07 LAB — MYOCARDIAL PERFUSION IMAGING
LV dias vol: 42 mL (ref 46–106)
LV sys vol: 11 mL (ref 3.8–5.2)
Nuc Stress EF: 74 %
Peak HR: 90 {beats}/min
Rest HR: 72 {beats}/min
Rest Nuclear Isotope Dose: 10.9 mCi
SDS: 0
SRS: 0
SSS: 0
ST Depression (mm): 0 mm
Stress Nuclear Isotope Dose: 31.4 mCi
TID: 0.62

## 2023-10-07 MED ORDER — REGADENOSON 0.4 MG/5ML IV SOLN
INTRAVENOUS | Status: AC
  Filled 2023-10-07: qty 5

## 2023-10-07 MED ORDER — REGADENOSON 0.4 MG/5ML IV SOLN
0.4000 mg | Freq: Once | INTRAVENOUS | Status: AC
  Administered 2023-10-07: 0.4 mg via INTRAVENOUS

## 2023-10-07 MED ORDER — TECHNETIUM TC 99M TETROFOSMIN IV KIT
10.9000 | PACK | Freq: Once | INTRAVENOUS | Status: AC | PRN
  Administered 2023-10-07: 10.9 via INTRAVENOUS

## 2023-10-07 MED ORDER — TECHNETIUM TC 99M TETROFOSMIN IV KIT
31.4000 | PACK | Freq: Once | INTRAVENOUS | Status: AC | PRN
  Administered 2023-10-07: 31.4 via INTRAVENOUS

## 2023-10-07 NOTE — Assessment & Plan Note (Signed)
 Assessment and Plan:  LDL goal <70 mg/dl. Last LDLc 92 mg/dl (93/86/74) while on Lipitor 20 gm daily. Lately having muscle cramps. Flexeril has provided partial relief. Advised to reduce Lipitor dose to 10 mg daily to see it would help with muscle cramps. Will assess the tolerability at next OV in 4 weeks if needed we will optimize lipid therapy

## 2023-10-07 NOTE — Patient Instructions (Addendum)
 Changes made by your pharmacist Robbi Blanch, PharmD at today's visit:    Instructions/Changes  (what do you need to do) Your Notes  (what you did and when you did it)  Get BMP -lab checked today or tomorrow     Continue taking Bisoprolol 5 mg twice daily, olmesartan  20 mg daily and amlodipine  10 mg daily , hydrochlorothiazide  12.5 mg daily    Reduce salt intake - stop drinking pickle  juice    Reduce Lipitor dose from 20 mg daily to 10 mg daily and increase magnesium  supplement dose from 1 tab at night to 2 tabs at night     Bring all of your meds, your BP cuff and your record of home blood pressures to your next appointment.    HOW TO TAKE YOUR BLOOD PRESSURE AT HOME  Rest 5 minutes before taking your blood pressure.  Don't smoke or drink caffeinated beverages for at least 30 minutes before. Take your blood pressure before (not after) you eat. Sit comfortably with your back supported and both feet on the floor (don't cross your legs). Elevate your arm to heart level on a table or a desk. Use the proper sized cuff. It should fit smoothly and snugly around your bare upper arm. There should be enough room to slip a fingertip under the cuff. The bottom edge of the cuff should be 1 inch above the crease of the elbow. Ideally, take 3 measurements at one sitting and record the average.  Important lifestyle changes to control high blood pressure  Intervention  Effect on the BP  Lose extra pounds and watch your waistline Weight loss is one of the most effective lifestyle changes for controlling blood pressure. If you're overweight or obese, losing even a small amount of weight can help reduce blood pressure. Blood pressure might go down by about 1 millimeter of mercury (mm Hg) with each kilogram (about 2.2 pounds) of weight lost.  Exercise regularly As a general goal, aim for at least 30 minutes of moderate physical activity every day. Regular physical activity can lower high blood pressure  by about 5 to 8 mm Hg.  Eat a healthy diet Eating a diet rich in whole grains, fruits, vegetables, and low-fat dairy products and low in saturated fat and cholesterol. A healthy diet can lower high blood pressure by up to 11 mm Hg.  Reduce salt (sodium) in your diet Even a small reduction of sodium in the diet can improve heart health and reduce high blood pressure by about 5 to 6 mm Hg.  Limit alcohol  One drink equals 12 ounces of beer, 5 ounces of wine, or 1.5 ounces of 80-proof liquor.  Limiting alcohol  to less than one drink a day for women or two drinks a day for men can help lower blood pressure by about 4 mm Hg.   If you have any questions or concerns please use My Chart to send questions or call the office at [phone]

## 2023-10-07 NOTE — Assessment & Plan Note (Addendum)
 Assessment and Plan:  BP is uncontrolled in office BP 156/72 mmHg 2nd reading 148/72 heart rate 75 (goal <130/80). Patient has been taking current BP meds regularly  and tolerating them well   Denies SOB, palpitation, chest pain, headaches,or swelling Forgot to bring home BP log and BP monitor doesn't recall home BP readings  From last few weeks taking pickle juice which is affecting BP. last BMP early Aug showed elevation in BUN & SrCr  pt was advised to stopped hydrochlorothiazide  however, she has continued taking it by mistake  Will get updated BMP today, encouraged patient to drink at least 6-8 glasses of water per day  Continue taking Bisoprolol 5 mg twice daily, olmesartan  20 mg daily hydrochlorothiazide  12.5 mg daily, amlodipine  10 mg daily  Patient to keep record of BP readings with heart rate and report to us  at the next visit Patient to bring BP monitor for validation at next OV  Pt to stop drinking pickle juice and stay vigilant with overall sodium intake Pt will be seeing Dr.Schumann on Sept 2  Patient to see PharmD in 4 weeks for follow up  Follow up lab(s):BMP today or tomorrow if SrCr elevated will hold HCTZ

## 2023-10-08 ENCOUNTER — Other Ambulatory Visit: Payer: Self-pay | Admitting: *Deleted

## 2023-10-08 DIAGNOSIS — I10 Essential (primary) hypertension: Secondary | ICD-10-CM | POA: Diagnosis not present

## 2023-10-08 NOTE — Progress Notes (Unsigned)
 bmet

## 2023-10-08 NOTE — Telephone Encounter (Signed)
 Called patient and made patient aware to stop Hydrochlorothiazide  per Dr. Kate and to have lab work drawn in week. Made patient aware to take blood pressure twice a day for next week and send or drop off those readings. Made patient aware to stay hydrated. Understanding verbalized.

## 2023-10-09 LAB — BASIC METABOLIC PANEL WITH GFR
BUN/Creatinine Ratio: 24 (ref 12–28)
BUN: 35 mg/dL — ABNORMAL HIGH (ref 8–27)
CO2: 19 mmol/L — ABNORMAL LOW (ref 20–29)
Calcium: 9.4 mg/dL (ref 8.7–10.3)
Chloride: 106 mmol/L (ref 96–106)
Creatinine, Ser: 1.48 mg/dL — ABNORMAL HIGH (ref 0.57–1.00)
Glucose: 89 mg/dL (ref 70–99)
Potassium: 5.2 mmol/L (ref 3.5–5.2)
Sodium: 142 mmol/L (ref 134–144)
eGFR: 37 mL/min/1.73 — ABNORMAL LOW (ref >59)

## 2023-10-11 ENCOUNTER — Ambulatory Visit: Payer: Self-pay | Admitting: Cardiology

## 2023-10-13 ENCOUNTER — Telehealth: Payer: Self-pay | Admitting: Cardiovascular Disease

## 2023-10-13 ENCOUNTER — Ambulatory Visit: Payer: Self-pay | Admitting: Pharmacist

## 2023-10-13 NOTE — Telephone Encounter (Signed)
 SrCr still elevated, advised to increase water intake and hold hydrochlorothiazide  for 1 week and repeat BMP in a week. Pt coming to see Dr.Schumann on Sept 2 will get BMP done on that day.

## 2023-10-13 NOTE — Telephone Encounter (Signed)
 Patient returned pharmacist's call and stated she will be at home after 1:00 pm today.

## 2023-10-14 ENCOUNTER — Ambulatory Visit (HOSPITAL_COMMUNITY)
Admission: RE | Admit: 2023-10-14 | Discharge: 2023-10-14 | Disposition: A | Discharge status: Home / Self Care | Source: Ambulatory Visit | Attending: Cardiology | Admitting: Cardiology

## 2023-10-14 DIAGNOSIS — K5730 Diverticulosis of large intestine without perforation or abscess without bleeding: Secondary | ICD-10-CM | POA: Insufficient documentation

## 2023-10-14 DIAGNOSIS — I70203 Unspecified atherosclerosis of native arteries of extremities, bilateral legs: Secondary | ICD-10-CM | POA: Diagnosis not present

## 2023-10-14 DIAGNOSIS — I7409 Other arterial embolism and thrombosis of abdominal aorta: Secondary | ICD-10-CM | POA: Diagnosis not present

## 2023-10-14 DIAGNOSIS — I701 Atherosclerosis of renal artery: Secondary | ICD-10-CM | POA: Diagnosis not present

## 2023-10-14 DIAGNOSIS — I700 Atherosclerosis of aorta: Secondary | ICD-10-CM | POA: Diagnosis not present

## 2023-10-14 DIAGNOSIS — Q619 Cystic kidney disease, unspecified: Secondary | ICD-10-CM | POA: Diagnosis not present

## 2023-10-14 MED ORDER — IOHEXOL 350 MG/ML SOLN
100.0000 mL | Freq: Once | INTRAVENOUS | Status: AC | PRN
  Administered 2023-10-14: 100 mL via INTRAVENOUS

## 2023-10-18 NOTE — Progress Notes (Unsigned)
 Cardiology Office Note:    Date:  10/20/2023   ID:  GIAH FICKETT, DOB [birth year], MRN [id number]  PCP:  Yolande Toribio MATSU, MD  Cardiologist:  None  Electrophysiologist:  None   Referring MD: Yolande Toribio MATSU, MD   Chief Complaint  Patient presents with   Coronary Artery Disease    History of Present Illness:    Olivia Cook is a 73 y.o. female with a hx of COPD, hypertension, hyperlipidemia, hypothyroidism who presents for follow-up.  She was referred by Dr. Yolande for evaluation of CAD,seen 07/07/2023.  On CT chest 04/01/2023 noted to have severe  three-vessel coronary calcifications.  Denies any chest pain, lightheadedness, syncope, lower extremity edema, or palpitations.  She does report she has been having shortness of breath, especially with going up and down stairs.  Reports pain in legs with walking upstairs.  She had some lower extremity edema when started diltiazem but has improved.  She denies any lightheadedness or syncope.  She smoked for 50 years, about 0.5 packs/day at peak but now down to 2 to 3 cigarettes/day.  Family history includes father CABG in 14s and mother had CHF.  Echocardiogram 09/07/2023 showed normal biventricular function, no significant valvular disease.  Lexiscan  Myoview  10/07/2023 showed normal perfusion, LVEF 74%, severe coronary calcifications.  ABIs 08/2023 were 0.42 on right, 0.52 on left.  Lower extremity duplex showed monophasic waveforms in bilateral CFA consistent with inflow disease.  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  Since last clinic visit, she notes she is doing okay.  Main issue is having leg pain.  She denies any chest pain.  She continues to have shortness of breath.  She denies any lightheadedness, syncope, lower extreme edema, or palpitations.  She continues to smoke, about 2 cigarettes/day.   Past Medical History:  Diagnosis Date   Allergy    SEASONAL    Anxiety    Arthritis    Emphysema of lung (HCC)    Hyperlipidemia    Hypertension    Thyroid  disease     Past Surgical History:  Procedure Laterality Date   COLONOSCOPY     POLYPECTOMY     VAGINAL HYSTERECTOMY N/A 1978    Current Medications: Current Meds  Medication Sig   acetaminophen (TYLENOL) 500 MG tablet Take 500 mg by mouth every 6 (six) hours as needed.   amLODipine  (NORVASC ) 10 MG tablet Take 1 tablet (10 mg total) by mouth daily.   aspirin EC 81 MG tablet Take 81 mg by mouth daily.   atorvastatin (LIPITOR) 20 MG tablet Take 20 mg by mouth daily.   bisoprolol (ZEBETA) 5 MG tablet Take by mouth. (Patient taking differently: Take 5 mg by mouth 2 (two) times daily.)   budesonide-formoterol (SYMBICORT) 160-4.5 MCG/ACT inhaler Inhale 2 puffs into the lungs 2 (two) times daily.   buPROPion (WELLBUTRIN SR) 150 MG 12 hr tablet Take 150 mg by mouth daily.   cyclobenzaprine (FLEXERIL) 10 MG tablet Take 10 mg by mouth at bedtime.   hydrochlorothiazide  (MICROZIDE ) 12.5 MG capsule Take 1 capsule (12.5 mg total) by mouth daily.   levothyroxine (SYNTHROID) 100 MCG tablet Take by mouth.   Magnesium  Oxide -Mg Supplement 400 MG CAPS Take 400 mg by mouth daily.   olmesartan  (BENICAR ) 20 MG tablet Take 1 tablet (20 mg total) by mouth daily.   OVER THE COUNTER MEDICATION 1 tablet every morning.     Allergies:  Patient has no known allergies.   Social History   Socioeconomic History   Marital status: Widowed    Spouse name: Not on file   Number of children: Not on file   Years of education: Not on file   Highest education level: Not on file  Occupational History   Not on file  Tobacco Use   Smoking status: Some Days   Smokeless tobacco: Never   Tobacco comments:    SMOKES SOME DAYS, NOT DAILY   Vaping Use   Vaping status: Never Used  Substance and Sexual Activity   Alcohol  use: No   Drug use: No   Sexual activity: Not on file  Other Topics Concern   Not on file  Social  History Narrative   Not on file   Social Drivers of Health   Financial Resource Strain: Not on file  Food Insecurity: Low Risk  (05/06/2023)   Received from [facility]   Hunger Vital Sign    Within the past 12 months, you worried that your food would run out before you got money to buy more: Never true    Within the past 12 months, the food you bought just didn't last and you didn't have money to get more. : Never true  Transportation Needs: No Transportation Needs (05/06/2023)   Received from [facility]    In the past 12 months, has lack of reliable transportation kept you from medical appointments, meetings, work or from getting things needed for daily living? : No  Physical Activity: Not on file  Stress: Not on file  Social Connections: Unknown (06/30/2021)   Received from [facility]   Social Network    Social Network: Not on file     Family History: The patient's family history includes Colon cancer in her sister. There is no history of Esophageal cancer, Rectal cancer, Stomach cancer, or Colon polyps.  ROS:   Please see the history of present illness.     All other systems reviewed and are negative.  EKGs/Labs/Other Studies Reviewed:    The following studies were reviewed today:   EKG:   07/15/2023: Normal sinus rhythm, rate 69, less than 1 mm ST depressions in leads II, aVF and V4-6, nonspecific T wave flattening  Recent Labs: 07/15/2023: ALT 14; Hemoglobin 12.7; Platelets 310 07/31/2023: Magnesium  2.1 10/08/2023: BUN 35; Creatinine, Ser 1.48; Potassium 5.2; Sodium 142  Recent Lipid Panel    Component Value Date/Time   CHOL 190 07/31/2023 0848   TRIG 335 (H) 07/31/2023 0848   HDL 42 07/31/2023 0848   CHOLHDL 4.5 (H) 07/31/2023 0848   LDLCALC 92 07/31/2023 0848    Physical Exam:    VS:  BP (!) 156/70 (BP Location: Left Arm, Patient Position: Sitting, Cuff Size: Normal)   Pulse 75   Ht 4' 10 (1.473 m)   Wt 148 lb (67.1 kg)   SpO2 95%    BMI 30.93 kg/m     Wt Readings from Last 3 Encounters:  10/20/23 148 lb (67.1 kg)  09/29/23 148 lb (67.1 kg)  07/15/23 151 lb 3.2 oz (68.6 kg)     GEN:  Well nourished, well developed in no acute distress HEENT: Normal NECK: No JVD; right carotid bruit CARDIAC: RRR, no murmurs, rubs, gallops RESPIRATORY:  mild wheezing ABDOMEN: Soft, non-tender, non-distended MUSCULOSKELETAL:  No edema; No deformity  SKIN: Warm and dry NEUROLOGIC:  Alert and oriented x 3 PSYCHIATRIC:  Normal affect   ASSESSMENT:  1. Coronary artery disease involving native coronary artery of native heart, unspecified whether angina present   2. PAD (peripheral artery disease) (HCC)   3. Essential hypertension   4. Hyperlipidemia, unspecified hyperlipidemia type   5. Stage 3b chronic kidney disease (HCC)   6. Tobacco use     PLAN:    CAD: Severe multivessel coronary calcifications noted on CT chest 03/2023.  She denies chest pain but is reporting dyspnea on exertion that could represent anginal equivalent.  Echocardiogram 09/07/2023 showed normal biventricular function, no significant valvular disease.  Lexiscan  Myoview  10/07/2023 showed normal perfusion, LVEF 74%, severe coronary calcifications.  Denies any exertional symptoms - Continue aspirin 81 mg daily - Continue atorvastatin 20 mg daily  PAD: she reported having leg pain. ABIs 08/2023 were 0.42 on right, 0.52 on left.  Lower extremity duplex showed monophasic waveforms in bilateral CFA consistent with inflow disease.  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 -Referred to Dr Court for evaluation for peripheral intervention -Continue aspirin, statin  Hypertension: On bisoprolol 5 mg twice daily,  olmesartan  20 mg daily, amlodipine  10 mg daily.  BP elevated, asked to check BP twice daily for next week and let us  know results  Hyperlipidemia: On atorvastatin 20 mg daily.  LDL 79 on  08/19/22.  Update lipid panel  Carotid stenosis: Mild (1-39% stenosis) in bilateral carotid arteries on duplex 10/2018.  Will monitor  Tobacco use: Counseled on risk of tobacco use and cessation strongly encouraged  CKD stage IIIb: Creatinine 1.48 on 10/08/2023. Have been holding HCTZ, recheck BMET  RTC in 3 months   Medication Adjustments/Labs and Tests Ordered: Current medicines are reviewed at length with the patient today.  Concerns regarding medicines are outlined above.  Orders Placed This Encounter  Procedures   Basic Metabolic Panel (BMET)   Lipid panel   Magnesium    No orders of the defined types were placed in this encounter.   Patient Instructions  Medication Instructions:  Continue current medicaitons *If you need a refill on your cardiac medications before your next appointment, please call your pharmacy*  Lab Work: Mg, bmet, lipid today If you have labs (blood work) drawn today and your tests are completely normal, you will receive your results only by: MyChart Message (if you have MyChart) OR A paper copy in the mail If you have any lab test that is abnormal or we need to change your treatment, we will call you to review the results.  Testing/Procedures: none  Follow-Up: At [facility], you and your health needs are our priority.  As part of our continuing mission to provide you with exceptional heart care, our providers are all part of one team.  This team includes your primary Cardiologist (physician) and Advanced Practice Providers or APPs (Physician Assistants and Nurse Practitioners) who all work together to provide you with the care you need, when you need it.  Your next appointment:   3 months  Provider:   Dr. Kate  We recommend signing up for the patient portal called MyChart.  Sign up information is provided on this After Visit Summary.  MyChart is used to connect with patients for Virtual Visits (Telemedicine).  Patients are able to  view lab/test results, encounter notes, upcoming appointments, etc.  Non-urgent messages can be sent to your provider as well.   To learn more about what you can do with MyChart, go to [email or web address].   Other Instructions  Please check blood pressure twicw a day for one week and let office know those readings       Signed, Lonni LITTIE Nanas, MD  10/20/2023 4:18 PM    [facility]

## 2023-10-20 ENCOUNTER — Other Ambulatory Visit: Payer: Self-pay

## 2023-10-20 ENCOUNTER — Ambulatory Visit: Attending: Cardiology | Admitting: Cardiology

## 2023-10-20 ENCOUNTER — Encounter: Payer: Self-pay | Admitting: Cardiology

## 2023-10-20 VITALS — BP 156/70 | HR 75 | Ht <= 58 in | Wt 148.0 lb

## 2023-10-20 DIAGNOSIS — I10 Essential (primary) hypertension: Secondary | ICD-10-CM

## 2023-10-20 DIAGNOSIS — N1832 Chronic kidney disease, stage 3b: Secondary | ICD-10-CM | POA: Diagnosis not present

## 2023-10-20 DIAGNOSIS — I2510 Atherosclerotic heart disease of native coronary artery without angina pectoris: Secondary | ICD-10-CM

## 2023-10-20 DIAGNOSIS — E785 Hyperlipidemia, unspecified: Secondary | ICD-10-CM | POA: Diagnosis not present

## 2023-10-20 DIAGNOSIS — Z720 Tobacco use: Secondary | ICD-10-CM

## 2023-10-20 DIAGNOSIS — I739 Peripheral vascular disease, unspecified: Secondary | ICD-10-CM | POA: Diagnosis not present

## 2023-10-20 NOTE — Patient Instructions (Signed)
 Medication Instructions:  Continue current medicaitons *If you need a refill on your cardiac medications before your next appointment, please call your pharmacy*  Lab Work: Mg, bmet, lipid today If you have labs (blood work) drawn today and your tests are completely normal, you will receive your results only by: MyChart Message (if you have MyChart) OR A paper copy in the mail If you have any lab test that is abnormal or we need to change your treatment, we will call you to review the results.  Testing/Procedures: none  Follow-Up: At [facility], you and your health needs are our priority.  As part of our continuing mission to provide you with exceptional heart care, our providers are all part of one team.  This team includes your primary Cardiologist (physician) and Advanced Practice Providers or APPs (Physician Assistants and Nurse Practitioners) who all work together to provide you with the care you need, when you need it.  Your next appointment:   3 months  Provider:   Dr. Kate  We recommend signing up for the patient portal called MyChart.  Sign up information is provided on this After Visit Summary.  MyChart is used to connect with patients for Virtual Visits (Telemedicine).  Patients are able to view lab/test results, encounter notes, upcoming appointments, etc.  Non-urgent messages can be sent to your provider as well.   To learn more about what you can do with MyChart, go to [email or web address].   Other Instructions Please check blood pressure twicw a day for one week and let office know those readings

## 2023-10-21 ENCOUNTER — Ambulatory Visit: Payer: Self-pay | Admitting: Cardiology

## 2023-10-21 DIAGNOSIS — E875 Hyperkalemia: Secondary | ICD-10-CM

## 2023-10-21 DIAGNOSIS — I10 Essential (primary) hypertension: Secondary | ICD-10-CM

## 2023-10-21 LAB — BASIC METABOLIC PANEL WITH GFR
BUN/Creatinine Ratio: 24 (ref 12–28)
BUN: 35 mg/dL — ABNORMAL HIGH (ref 8–27)
CO2: 21 mmol/L (ref 20–29)
Calcium: 9.5 mg/dL (ref 8.7–10.3)
Chloride: 106 mmol/L (ref 96–106)
Creatinine, Ser: 1.48 mg/dL — ABNORMAL HIGH (ref 0.57–1.00)
Glucose: 99 mg/dL (ref 70–99)
Potassium: 5.6 mmol/L — ABNORMAL HIGH (ref 3.5–5.2)
Sodium: 141 mmol/L (ref 134–144)
eGFR: 37 mL/min/1.73 — ABNORMAL LOW (ref >59)

## 2023-10-21 LAB — LIPID PANEL
Chol/HDL Ratio: 4.2 ratio (ref 0.0–4.4)
Cholesterol, Total: 164 mg/dL (ref 100–199)
HDL: 39 mg/dL — ABNORMAL LOW (ref >39)
LDL Chol Calc (NIH): 70 mg/dL (ref 0–99)
Triglycerides: 348 mg/dL — ABNORMAL HIGH (ref 0–149)
VLDL Cholesterol Cal: 55 mg/dL — ABNORMAL HIGH (ref 5–40)

## 2023-10-21 LAB — MAGNESIUM: Magnesium: 2.2 mg/dL (ref 1.6–2.3)

## 2023-10-21 MED ORDER — CHLORTHALIDONE 12.5 MG PO TABS: 12.5000 mg | ORAL_TABLET | Freq: Every morning | ORAL | 3 refills | Status: DC

## 2023-10-21 NOTE — Telephone Encounter (Signed)
 Kate Lonni CROME, MD to Trudy Frankey SAUNDERS, RN  (Selected Message)    10/21/23  6:18 AM Result Note Renal function is stable but potassium is elevated.  She is no longer on hydrochlorothiazide - can this be removed from her list?  Her BP remained elevated in clinic yesterday.  Recommend adding chlorthalidone  12.5 mg daily and avoid high potassium foods.  Stay hydrated.  Repeat BMET at beginning of next week.  Monitor BP daily and bring log to upcoming pharmacy appointment  Went over the information above with the patient. RX sent in to pharmacy. Order placed for BMP- informed pt that she can get it done at any [facility]. Gave location of one in [place]. She will get it done the beginning of next week. She reports that she has been keeping a BP log daily and will continue.  She verbalized understanding of all information.

## 2023-10-28 ENCOUNTER — Encounter: Payer: Self-pay | Admitting: Cardiovascular Disease

## 2023-10-28 ENCOUNTER — Other Ambulatory Visit (HOSPITAL_COMMUNITY): Payer: Self-pay

## 2023-10-28 ENCOUNTER — Ambulatory Visit: Attending: Cardiovascular Disease | Admitting: Cardiovascular Disease

## 2023-10-28 VITALS — BP 148/98 | HR 74 | Ht <= 58 in | Wt 148.6 lb

## 2023-10-28 DIAGNOSIS — I739 Peripheral vascular disease, unspecified: Secondary | ICD-10-CM | POA: Diagnosis not present

## 2023-10-28 MED ORDER — CHLORTHALIDONE 25 MG PO TABS
12.5000 mg | ORAL_TABLET | Freq: Every morning | ORAL | 3 refills | Status: DC
  Filled 2023-10-28: qty 45, 90d supply, fill #0

## 2023-10-28 NOTE — Assessment & Plan Note (Signed)
 Olivia Cook returns today for follow-up of her CTA.  She was originally sent to me by Dr. Kate for claudication.  Her Doppler studies performed 09/16/2023 revealed a right ABI of 0.42 and a left of 0.52 with monophasic waveforms bilaterally.  I subsequently performed a CTA on her 10/14/2023 revealing a small right aorta which narrowed down to 4 mm above the iliac bifurcation with severe calcific inflow disease of the right common iliac artery, moderate on the left.  She also had some SFA and popliteal disease.  I suspect that her anatomy is surgical.  When asked about her degree of claudication it does not seem to affect her activities of daily living and she wishes to delay further evaluation and treatment.  I will see her back in 1 year.

## 2023-10-28 NOTE — Patient Instructions (Signed)

## 2023-10-28 NOTE — Progress Notes (Signed)
 10/28/2023 Olivia Cook   [birth year]  [id number]  Primary Physician Yolande Toribio MATSU, MD Primary Cardiologist: Dorn JINNY Lesches MD GENI CODY MADEIRA, FSCAI  HPI:  Olivia Cook is a 73 y.o.  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  She is referred by her cardiologist, Dr. Kate, for evaluation of PAD.  I last saw her in the office 09/29/2023.  She does live with her 30 year old grandson.  Her cardiovascular risk factors include family history of heart disease with father who had bypass surgery in [place]  back in the late 70s/early 80s.  She has treated hypertension and hyperlipidemia.  She does smoke but in low quantities.  She denies dyspnea but denies chest pain.  She has had claudication bilaterally for several months which is symmetric more noticeable when walking up stairs or walking any distances.  She had lower extremity arterial Doppler studies performed 09/16/2023 revealing a right ABI of 0.42, left of 0.52 with monophasic waveforms throughout both lower extremities suggesting a more proximal lesion.  I obtained a aortobifem CTA that revealed severe narrowing of the distal abdominal aorta down to 4 mm with severe inflow disease on the right and moderate on the left as well as SFA and popliteal disease.  I suspect that her anatomy is more suitable for surgical revascularization.  When asked about the severity of her symptoms they do not seem to be lifestyle-limiting at the current time.  I will see her back in 1 year to reassess.   Current Meds  Medication Sig   acetaminophen (TYLENOL) 500 MG tablet Take 500 mg by mouth every 6 (six) hours as needed.   amLODipine  (NORVASC ) 10 MG tablet Take 1 tablet (10 mg total) by mouth daily.   aspirin EC 81 MG tablet Take 81 mg by mouth daily.   atorvastatin (LIPITOR) 20 MG tablet Take 20 mg by  mouth daily.   bisoprolol (ZEBETA) 5 MG tablet Take by mouth. (Patient taking differently: Take 5 mg by mouth 2 (two) times daily.)   budesonide-formoterol (SYMBICORT) 160-4.5 MCG/ACT inhaler Inhale 2 puffs into the lungs 2 (two) times daily.   buPROPion (WELLBUTRIN SR) 150 MG 12 hr tablet Take 150 mg by mouth daily.   cyclobenzaprine (FLEXERIL) 10 MG tablet Take 10 mg by mouth at bedtime.   levothyroxine (SYNTHROID) 100 MCG tablet Take by mouth.   Magnesium  Oxide -Mg Supplement 400 MG CAPS Take 400 mg by mouth daily.   olmesartan  (BENICAR ) 20 MG tablet Take 1 tablet (20 mg total) by mouth daily.   OVER THE COUNTER MEDICATION 1 tablet every morning.     No Known Allergies  Social History   Socioeconomic History   Marital status: Widowed    Spouse name: Not on file   Number of children: Not on file   Years of education: Not on file   Highest education level: Not on file  Occupational History   Not on file  Tobacco Use   Smoking status: Some Days   Smokeless tobacco: Never   Tobacco comments:    SMOKES SOME DAYS, NOT DAILY   Vaping Use   Vaping status: Never Used  Substance and Sexual Activity   Alcohol  use: No   Drug use: No   Sexual activity: Not on file  Other Topics Concern   Not on file  Social History Narrative  Not on file   Social Drivers of Health   Financial Resource Strain: Not on file  Food Insecurity: Low Risk  (05/06/2023)   Received from [facility]   Hunger Vital Sign    Within the past 12 months, you worried that your food would run out before you got money to buy more: Never true    Within the past 12 months, the food you bought just didn't last and you didn't have money to get more. : Never true  Transportation Needs: No Transportation Needs (05/06/2023)   Received from [facility]    In the past 12 months, has lack of reliable transportation kept you from medical appointments, meetings, work or from getting things needed for  daily living? : No  Physical Activity: Not on file  Stress: Not on file  Social Connections: Unknown (06/30/2021)   Received from [facility]   Social Network    Social Network: Not on file  Intimate Partner Violence: Unknown (05/22/2021)   Received from [facility]   HITS    Physically Hurt: Not on file    Insult or Talk Down To: Not on file    Threaten Physical Harm: Not on file    Scream or Curse: Not on file     Review of Systems: General: negative for chills, fever, night sweats or weight changes.  Cardiovascular: negative for chest pain, dyspnea on exertion, edema, orthopnea, palpitations, paroxysmal nocturnal dyspnea or shortness of breath Dermatological: negative for rash Respiratory: negative for cough or wheezing Urologic: negative for hematuria Abdominal: negative for nausea, vomiting, diarrhea, bright red blood per rectum, melena, or hematemesis Neurologic: negative for visual changes, syncope, or dizziness All other systems reviewed and are otherwise negative except as noted above.    Blood pressure (!) 148/98, pulse 74, height 4' 10 (1.473 m), weight 148 lb 9.6 oz (67.4 kg), SpO2 98%.  General appearance: alert and no distress Neck: no adenopathy, no carotid bruit, no JVD, supple, symmetrical, trachea midline, and thyroid  not enlarged, symmetric, no tenderness/mass/nodules Lungs: clear to auscultation bilaterally Heart: regular rate and rhythm, S1, S2 normal, no murmur, click, rub or gallop Extremities: extremities normal, atraumatic, no cyanosis or edema Pulses: Decreased pedal pulses Skin: Skin color, texture, turgor normal. No rashes or lesions Neurologic: Grossly normal  EKG not performed today      ASSESSMENT AND PLAN:   Peripheral arterial disease (HCC) Olivia Cook returns today for follow-up of her CTA.  She was originally sent to me by Dr. Kate for claudication.  Her Doppler studies performed 09/16/2023 revealed a right ABI of 0.42 and a left of  0.52 with monophasic waveforms bilaterally.  I subsequently performed a CTA on her 10/14/2023 revealing a small right aorta which narrowed down to 4 mm above the iliac bifurcation with severe calcific inflow disease of the right common iliac artery, moderate on the left.  She also had some SFA and popliteal disease.  I suspect that her anatomy is surgical.  When asked about her degree of claudication it does not seem to affect her activities of daily living and she wishes to delay further evaluation and treatment.  I will see her back in 1 year.     Dorn DOROTHA Lesches MD FACP,FACC,FAHA, [facility] 10/28/2023 10:35 AM

## 2023-10-31 LAB — BASIC METABOLIC PANEL WITH GFR
BUN/Creatinine Ratio: 23 (ref 12–28)
BUN: 37 mg/dL — ABNORMAL HIGH (ref 8–27)
CO2: 19 mmol/L — ABNORMAL LOW (ref 20–29)
Calcium: 9 mg/dL (ref 8.7–10.3)
Chloride: 107 mmol/L — ABNORMAL HIGH (ref 96–106)
Creatinine, Ser: 1.61 mg/dL — ABNORMAL HIGH (ref 0.57–1.00)
Glucose: 84 mg/dL (ref 70–99)
Potassium: 5.1 mmol/L (ref 3.5–5.2)
Sodium: 142 mmol/L (ref 134–144)
eGFR: 34 mL/min/1.73 — ABNORMAL LOW (ref >59)

## 2023-11-04 DIAGNOSIS — R911 Solitary pulmonary nodule: Secondary | ICD-10-CM | POA: Diagnosis not present

## 2023-11-04 DIAGNOSIS — J449 Chronic obstructive pulmonary disease, unspecified: Secondary | ICD-10-CM | POA: Diagnosis not present

## 2023-11-11 ENCOUNTER — Ambulatory Visit: Admitting: Pharmacist

## 2023-11-11 DIAGNOSIS — F17210 Nicotine dependence, cigarettes, uncomplicated: Secondary | ICD-10-CM | POA: Diagnosis not present

## 2023-11-11 DIAGNOSIS — J449 Chronic obstructive pulmonary disease, unspecified: Secondary | ICD-10-CM | POA: Diagnosis not present

## 2023-11-11 DIAGNOSIS — R911 Solitary pulmonary nodule: Secondary | ICD-10-CM | POA: Diagnosis not present

## 2023-11-11 DIAGNOSIS — J4540 Moderate persistent asthma, uncomplicated: Secondary | ICD-10-CM | POA: Diagnosis not present

## 2023-11-12 DIAGNOSIS — R911 Solitary pulmonary nodule: Secondary | ICD-10-CM | POA: Diagnosis not present

## 2023-11-20 DIAGNOSIS — J439 Emphysema, unspecified: Secondary | ICD-10-CM | POA: Diagnosis not present

## 2023-11-20 DIAGNOSIS — Z1152 Encounter for screening for COVID-19: Secondary | ICD-10-CM | POA: Diagnosis not present

## 2023-11-20 DIAGNOSIS — R0981 Nasal congestion: Secondary | ICD-10-CM | POA: Diagnosis not present

## 2023-11-20 DIAGNOSIS — R051 Acute cough: Secondary | ICD-10-CM | POA: Diagnosis not present

## 2023-11-20 DIAGNOSIS — R5383 Other fatigue: Secondary | ICD-10-CM | POA: Diagnosis not present

## 2023-11-20 DIAGNOSIS — R0602 Shortness of breath: Secondary | ICD-10-CM | POA: Diagnosis not present

## 2023-11-20 DIAGNOSIS — I10 Essential (primary) hypertension: Secondary | ICD-10-CM | POA: Diagnosis not present

## 2023-11-20 DIAGNOSIS — Z720 Tobacco use: Secondary | ICD-10-CM | POA: Diagnosis not present

## 2023-11-20 DIAGNOSIS — I2510 Atherosclerotic heart disease of native coronary artery without angina pectoris: Secondary | ICD-10-CM | POA: Diagnosis not present

## 2023-11-23 DIAGNOSIS — I10 Essential (primary) hypertension: Secondary | ICD-10-CM | POA: Diagnosis not present

## 2023-11-24 ENCOUNTER — Ambulatory Visit: Payer: Self-pay | Admitting: Cardiology

## 2023-11-24 DIAGNOSIS — N1832 Chronic kidney disease, stage 3b: Secondary | ICD-10-CM

## 2023-11-24 DIAGNOSIS — I10 Essential (primary) hypertension: Secondary | ICD-10-CM

## 2023-11-24 DIAGNOSIS — R911 Solitary pulmonary nodule: Secondary | ICD-10-CM | POA: Diagnosis not present

## 2023-11-24 LAB — BASIC METABOLIC PANEL WITH GFR
BUN/Creatinine Ratio: 27 (ref 12–28)
BUN: 50 mg/dL — ABNORMAL HIGH (ref 8–27)
CO2: 18 mmol/L — ABNORMAL LOW (ref 20–29)
Calcium: 9.3 mg/dL (ref 8.7–10.3)
Chloride: 104 mmol/L (ref 96–106)
Creatinine, Ser: 1.82 mg/dL — ABNORMAL HIGH (ref 0.57–1.00)
Glucose: 90 mg/dL (ref 70–99)
Potassium: 5.3 mmol/L — ABNORMAL HIGH (ref 3.5–5.2)
Sodium: 143 mmol/L (ref 134–144)
eGFR: 29 mL/min/1.73 — ABNORMAL LOW (ref >59)

## 2023-11-24 LAB — MAGNESIUM: Magnesium: 2.1 mg/dL (ref 1.6–2.3)

## 2023-11-24 NOTE — Progress Notes (Signed)
 Pulmonary Brief Note  Contacted by thoracic surgery for referral for diagnostic bronchoscopy of PET-avid 10 x 10mm LUL nodule and staging EBUS.   Recommendation for robotic bronchoscopy with staging EBUS at [facility].  Pre-procedure instructions reviewed with patient.  Patient does NOT require pre-procedure CT with ION protocol. Patient does not require pre-op anesthesia assessment.  Medications to hold: Hold morning of procedure: losartan (Cozaar), olmesartan  (Benicar ), hydrochlorothiazide  (Hydrodiuril ), chlorthalidone  (Hygroton )   Comer RONAL Blanc, DO Pulmonary and Critical Care Medicine

## 2023-11-24 NOTE — Progress Notes (Signed)
 CARDIOTHORACIC SURGERY CONSULT NOTE   NAME: Olivia Cook      DOB: 09/24/1950, 73 y.o. FMW:[id number] Date of Encounter: 11/24/2023  REFERRING PHYSICIAN(S): No Pcp REFERRING PHYSICIAN:No att. providers found  REASON FOR REFERRAL: LUL lung nodule   ASSESSMENT/PLAN:  73 y.o.female with a history of smoking, has a highly FDG avid spiculated LUL nodule under 2cm, peripheral. She is here to discuss options for treatment. I discussed option of removal by surgery as a diagnostic and therapeutic procedure. She does not feel that she would do well with surgery. She gets quite tired and DOE after 1 flight of stairs. FEV1 54%, DLCO 37%. Recent cardiac testing is normal TTE with normal LV and RVEF. She tells me she had a stress test and required no interventions, but I do not see this result.  I discussed other options such as robotic bronchoscopy and possibly SBRT if consensus is that this a lung cancer. She agrees with this plan. I have referred her for robotic bronchoscopy and to my radiation oncology colleagues to consider her case. She thanked me for the visit and can see me as needed. I reviewed and interpreted a CT scan, PET scan, PFTs, and echocardiogram for this visit and communicated with the above mentioned providers.       HISTORY OF PRESENT ILLNESS: Olivia Cook is a 73 y.o.female with a past medical history as noted below. They were seen in the thoracic surgery clinic on 11/24/2023 .    Medical History[1]   Surgical History[2]  Tobacco Use History[3] Social History   Substance and Sexual Activity  Alcohol  Use None   Social History   Substance and Sexual Activity  Drug Use Not on file    Family History[4]   Medications Ordered Prior to Encounter[5]  Allergies[6]  LABS:     No results found for: INR, PROTIME  VITAL SIGNS:  BP (!) 137/46 (BP Location: Left arm, Patient Position: Sitting) Comment: BP followed by PCP and[facility] at [facility]) - recently  changed meds/done  Pulse 81   Temp 97.7 F (36.5 C) (Oral)   Resp 18   Ht 1.499 m (4' 11)   Wt 65.8 kg (145 lb)   SpO2 98%   BMI 29.29 kg/m  65.8 kg (145 lb)   REVIEW OF SYSTEMS:  Pertinent items are noted in HPI.   PHYSICAL EXAMINATION:  General in NAD, AAOx3   HEENT Pupils equal and round; trachea midline; normocephalic/atraumatic; no conjunctival pallor   Cardio Regular rate and rhythm, no murmur, rubs or gallops  Pulm no increased work of breathing, Lungs clear to auscultation.  GI/Abd abdomen soft, non distended; no rebound tenderness or guarding; no bulge or mass appreciated  Ext warm well perfused, moving all extremities;  full range of motion;   Skin No rashes, lesions or jaundice  Neuro Motor and Sensory intact with no focal deficits noted   Psych Normal Mood and Affect    LAST  IMAGING  PET/CT Skull Base to Mid Thigh Result Date: 11/12/2023 PET/CT SKULL BASE TO MID THIGH, 11/12/2023 10:15 AM INDICATION: Lung nodule, > 8mm, Solitary pulmonary nodule \ R91.1 Solitary pulmonary nodule ADDITIONAL HISTORY: None. COMPARISON: CT 11/04/2023. TECHNIQUE: 60 minutes after the intravenous injection of 10.9 mCi F-18 FDG, PET imaging was obtained from the skull base through the mid thighs. These images were then attenuation corrected using low-dose CT and co-registered for anatomic localization. Standardized uptake values (SUV) were calculated using a lean body mass algorithm. Blood glucose at  the time of injection was 109 mg/dL. All CT scans at [facility] and [facility] are performed using radiation dose optimization techniques as appropriate to a performed exam, including but not limited to one or more of the following: automatic exposure control, adjustment of the mA and/or kV according to patient size, use of iterative reconstruction technique. In addition, our institution participates in a radiation dose monitoring program to optimize patient  radiation exposure. SUVmax of blood pool: 1.7 SUVmax of liver: 1.9 LIMITATIONS: None. FINDINGS: DISEASE-RELATED FINDINGS: *  Stable size of the FDG avid left upper lobe pulmonary nodule measuring up to approximately 10 mm x 10 mm on (series 4, image 51), with an SUV max of 6.2 (fused image 51). *  No FDG avid mediastinal or hilar lymphadenopathy. *  No new or increasing pulmonary nodules relative to CT dated 11/04/2023. *  No distant FDG avid lesion. OTHER TRACER-RELATED FINDINGS: Expected physiologic activity within the kidneys, ureter, bladder, oropharynx, salivary glands, stomach, bowel, and brain. ANCILLARY CT FINDINGS: *  Extensive calcification along the thoracic and abdominal aorta and major branches. Aberrant right subclavian artery. *  Coronary artery calcification. Aortic annular calcification. *  Possible nonobstructive left renal calculus versus vascular calcific dictation. *  Bilateral renal cysts. Several hyperattenuating subcentimeter left renal lesions, favored to represent hemorrhagic cysts. *  Centrilobular emphysematous changes. *  Diverticulosis   CONCLUSION: 1.  FDG avid left upper lobe pulmonary nodule, concerning for neoplasm. 2.  No FDG avid hilar or mediastinal lymphadenopathy.  No evidence of distant metastatic disease.   CT Outside Images Non Result Result Date: 11/11/2023 These images were imported for review and or comparison.  CT Outside Images Non Result Result Date: 11/11/2023 These images were imported for review and or comparison.  CT Chest WO Contrast Result Date: 11/04/2023 CT CHEST WO CONTRAST, 11/04/2023 9:23 AM INDICATION: Dyspnea, chronic, unclear etiology, Lung nodule, > 8mm, Chronic obstructive pulmonary disease, unspecified    (CMD) \ \ J44.9 Chronic obstructive pulmonary disease, unspecified    (CMD) \ R91.1 Solitary pulmonary nodule COMPARISON: None TECHNIQUE: Multislice axial images were obtained through the chest without administration of iodinated intravenous  contrast material. Multi-planar reformatted images were generated for additional analysis. Nongated technique limits cardiac detail. All CT scans at [facility] and [facility] are performed using radiation dose optimization techniques as appropriate to a performed exam, including but not limited to one or more of the following: automatic exposure control, adjustment of the mA and/or kV according to patient size, use of iterative reconstruction technique. In addition, our institution participates in a radiation dose monitoring program to optimize patient radiation exposure. FINDINGS: Thoracic inlet/central airways: Visualized thyroid  gland is normal.  Central airway is patent.  No cervical lymphadenopathy. Mediastinum/hila/axilla: No adenopathy. Small hiatal hernia Heart/vessels: Normal heart size. No pericardial effusion.  There is severe coronary artery calcification. Aorta is normal in caliber.  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 Heavy calcifications of the thoracic aorta without evidence of aneurysm. Main pulmonary artery is not dilated. Lungs/pleura: Spiculated left upper lobe pulmonary nodule measuring 12 x 10 mm (series 3 image 84) . Background of moderate to advanced centrilobular emphysema. Substantial paraseptal emphysema. Subpleural reticulation in the lung bases. No pleural effusion or pneumothorax. Upper abdomen: Intrinsically hyperdense left upper pole renal cyst, likely hemorrhagic or proteinaceous. No acute process in the visualized upper abdomen. Chest wall/MSK: No acute osseous findings or aggressive osseous  lesion. Mild multilevel degenerative changes.   1.  Spiculated left upper lobe pulmonary nodule measuring 12 x 10 mm, concerning for primary pulmonary neoplasm. Consider one of the following for both low-risk and high-risk individuals: (a) repeat chest CT in 3 months, (b) follow-up PET-CT, or (c) tissue sampling  (Per Fleischner Society guidelines). 2.  Background of moderate to advanced emphysema.    Kristine Leander, MD 11/24/2023 9:43 AM       [1] History reviewed. No pertinent past medical history. [2] Past Surgical History: Procedure Laterality Date  . PARTIAL HYSTERECTOMY  1978  [3] Social History Tobacco Use  Smoking Status Former  . Types: Cigarettes  . Passive exposure: Never  Smokeless Tobacco Never  Tobacco Comments   Quit smoking Sept 2025 (started in 1969)  [4] Family History Problem Relation Name Age of Onset  . Colon cancer Sister    [5] Current Outpatient Medications on File Prior to Visit  Medication Sig Dispense Refill  . acetaminophen (TYLENOL) 500 mg tablet Take 500 mg by mouth every 6 (six) hours as needed.    . amLODIPine  (NORVASC ) 10 mg tablet Take 10 mg by mouth daily.    SABRA aspirin 81 mg EC tablet Take 81 mg by mouth daily.    SABRA atorvastatin (LIPITOR) 20 mg tablet Take 20 mg by mouth at bedtime.    . bisoprolol (ZEBETA) 5 mg tablet Take 5 mg by mouth daily.    . budesonide-formoteroL (SYMBICORT;BREYNA) 160-4.5 mcg/actuation inhaler Inhale 2 puffs 2 (two) times a day.    SABRA buPROPion (WELLBUTRIN SR) 150 mg 12 hr tablet Take 150 mg by mouth daily.    . chlorthalidone  (HYGROTON ) 25 mg tablet Take 12.5 mg by mouth daily.    . cyclobenzaprine (FLEXERIL) 10 mg tablet Take 10 mg by mouth 3 (three) times a day as needed for muscle spasms.    . dilTIAZem (CARDIZEM CD) 240 mg 24 hr capsule Take 120 mg by mouth 2 (two) times a day. Indications: high blood pressure    . doxycycline (VIBRA-TABS) 100 mg tablet Take 1 tablet by mouth in the morning and 1 tablet in the evening.    . fluticasone-umeclidinium-vilanterol (Trelegy Ellipta) 100-62.5-25 mcg inhaler Inhale 1 puff once daily. 1 each 0  . fluticasone-umeclidinium-vilanterol (Trelegy Ellipta) 100-62.5-25 mcg inhaler Inhale 1 puff in the morning. 1 each 0  . hydroCHLOROthiazide  (HYDRODIURIL ) 12.5 mg capsule Take 12.5 mg  by mouth Once Daily. Indications: high blood pressure, visible water retention    . levothyroxine (SYNTHROID) 100 mcg tablet Take 100 mcg by mouth every morning.    SABRA losartan (COZAAR) 25 mg tablet Take 100 mg by mouth Once Daily. Indications: high blood pressure    . magnesium  oxide 400 mg magnesium  cap Take 400 mg by mouth daily.    . olmesartan  (BENICAR ) 20 mg tablet Take 20 mg by mouth daily.    . predniSONE (STERAPRED DS) 10 mg DsPk dose pack Take by mouth.     No current facility-administered medications on file prior to visit.  [6] No Known Allergies

## 2023-12-02 DIAGNOSIS — J4540 Moderate persistent asthma, uncomplicated: Secondary | ICD-10-CM | POA: Diagnosis not present

## 2023-12-02 DIAGNOSIS — R911 Solitary pulmonary nodule: Secondary | ICD-10-CM | POA: Diagnosis not present

## 2023-12-07 ENCOUNTER — Encounter: Payer: Self-pay | Admitting: Pharmacist

## 2023-12-07 ENCOUNTER — Other Ambulatory Visit: Payer: Self-pay | Admitting: Cardiology

## 2023-12-07 ENCOUNTER — Ambulatory Visit: Attending: Cardiovascular Disease | Admitting: Pharmacist

## 2023-12-07 ENCOUNTER — Other Ambulatory Visit (HOSPITAL_COMMUNITY): Payer: Self-pay

## 2023-12-07 VITALS — BP 135/62 | HR 69

## 2023-12-07 DIAGNOSIS — I10 Essential (primary) hypertension: Secondary | ICD-10-CM | POA: Diagnosis not present

## 2023-12-07 DIAGNOSIS — N1832 Chronic kidney disease, stage 3b: Secondary | ICD-10-CM | POA: Diagnosis not present

## 2023-12-07 MED ORDER — OMRON 3 SERIES BP MONITOR DEVI
1.0000 | Freq: Every day | 0 refills | Status: AC
  Filled 2023-12-07: qty 1, 30d supply, fill #0

## 2023-12-07 MED ORDER — HYDRALAZINE HCL 25 MG PO TABS
25.0000 mg | ORAL_TABLET | Freq: Three times a day (TID) | ORAL | 3 refills | Status: DC
  Filled 2023-12-07: qty 270, 90d supply, fill #0

## 2023-12-07 NOTE — Assessment & Plan Note (Signed)
 Assessment and Plan:  BP is uncontrolled in office BP 135/62 mmHg heart rate 69 (goal <130/80). Patient has been taking current BP meds regularly  and tolerating them well   Denies SOB, palpitation, chest pain, headaches,or swelling Chlorthalidone  and hydrochlorothiazide - up the SrCr  Continue taking Bisoprolol 5 mg twice daily, olmesartan  20 mg daily  amlodipine  5 mg twice daily Start taking hydralazine 25 mg three times daily  Patient to keep record of BP readings with heart rate and report to us  at the next visit Home BP machine validated today found to be inaccurate; will buy new one  Pt to bring home BP reading and new monitor for validation at next OV with me in 6 weeks

## 2023-12-07 NOTE — Progress Notes (Signed)
 Patient ID: Olivia Cook                 DOB: January 17, 1951                      MRN: [id number]      HPI: Olivia Cook is a 73 y.o. female referred by Dr. Kate to HTN clinic. PMH is significant for COPD, hypertension, hyperlipidemia, hypothyroidism, On CT chest 04/01/2023 noted to have severe  three-vessel coronary calcifications,Mild (1-39% stenosis) in bilateral carotid arteries on duplex 10/2018.  Amlodipine  was started on 07/15/23 for elevated BP and later dose was increased to 10 mg.  At last visit with MD BP was elevated, BMP showed elevated BUN and serum creatinine and K level.  hydrochlorothiazide  was changed to chlorthalidone . Other BP meds includes Bisoprolol 5 mg twice daily, olmesartan  20 mg daily   The patient presented today for a blood pressure follow-up visit. She brought in home BP monitor it was inaccurate. She has been off of chlorthalidone  for last 1.5 weeks. Her home BP ~ 144-140/58-60 range she was sick due to URI so she was put on short course of prednisone and doxycyline. She finished that few days back she feels better now. She had PET for lungs done - spot found f/u Biopsy due on coming Thursday. Still on low salt diet and does not eat processed food.     Current HTN meds: Bisoprolol 5 mg twice daily, olmesartan  20 mg daily amlodipine  5 mg twice daily  Previously tried: none  BP goal: <130/80   Family History:  elation Problem Comments  Mother (Deceased)   Father (Deceased)   Sister (Deceased) Colon cancer DX'D IN HER LATE 50'S     Social History:  Alcohol : none  Smoking: 2 cig per day - will be going to PCP this week to discuss the meds to quit smoking  Diet:  Coffee - 3 - 4 cups  Eats out - once a week  When eats home eats healthy   Home BP readings:140-144/58-60   Wt Readings from Last 3 Encounters:  10/28/23 148 lb 9.6 oz (67.4 kg)  10/20/23 148 lb (67.1 kg)  09/29/23 148 lb (67.1 kg)   BP Readings from Last 3 Encounters:  12/07/23 135/62   10/28/23 (!) 148/98  10/20/23 (!) 156/70   Pulse Readings from Last 3 Encounters:  12/07/23 69  10/28/23 74  10/20/23 75    Renal function: CrCl cannot be calculated (Unknown ideal weight.).  Past Medical History:  Diagnosis Date   Allergy    SEASONAL   Anxiety    Arthritis    Emphysema of lung (HCC)    Hyperlipidemia    Hypertension    Thyroid  disease     Current Outpatient Medications on File Prior to Visit  Medication Sig Dispense Refill   acetaminophen (TYLENOL) 500 MG tablet Take 500 mg by mouth every 6 (six) hours as needed.     amLODipine  (NORVASC ) 10 MG tablet Take 1 tablet (10 mg total) by mouth daily. 90 tablet 3   aspirin EC 81 MG tablet Take 81 mg by mouth daily.     atorvastatin (LIPITOR) 20 MG tablet Take 20 mg by mouth daily.     bisoprolol (ZEBETA) 5 MG tablet Take by mouth. (Patient taking differently: Take 5 mg by mouth 2 (two) times daily.)     budesonide-formoterol (SYMBICORT) 160-4.5 MCG/ACT inhaler Inhale 2 puffs into the lungs 2 (two) times daily.  buPROPion (WELLBUTRIN SR) 150 MG 12 hr tablet Take 150 mg by mouth daily.     cyclobenzaprine (FLEXERIL) 10 MG tablet Take 10 mg by mouth at bedtime.     levothyroxine (SYNTHROID) 100 MCG tablet Take by mouth.     Magnesium  Oxide -Mg Supplement 400 MG CAPS Take 400 mg by mouth daily. 90 capsule 3   olmesartan  (BENICAR ) 20 MG tablet Take 1 tablet (20 mg total) by mouth daily. 90 tablet 3   OVER THE COUNTER MEDICATION 1 tablet every morning.     No current facility-administered medications on file prior to visit.    No Known Allergies  Blood pressure 135/62, pulse 69, SpO2 96%.   Assessment/Plan:  1. Hypertension -  HYPERTENSION Assessment and Plan:  BP is uncontrolled in office BP 135/62 mmHg heart rate 69 (goal <130/80). Patient has been taking current BP meds regularly  and tolerating them well   Denies SOB, palpitation, chest pain, headaches,or swelling Chlorthalidone  and  hydrochlorothiazide - up the SrCr  Continue taking Bisoprolol 5 mg twice daily, olmesartan  20 mg daily  amlodipine  5 mg twice daily Start taking hydralazine 25 mg three times daily  Patient to keep record of BP readings with heart rate and report to us  at the next visit Home BP machine validated today found to be inaccurate; will buy new one  Pt to bring home BP reading and new monitor for validation at next OV with me in 6 weeks     Thank you  Robbi Blanch, Pharm.D [facility]. [facility] [address], [place], [place] [address] Phone: [phone]; Fax: [phone]

## 2023-12-08 LAB — BASIC METABOLIC PANEL WITH GFR
BUN/Creatinine Ratio: 26 (ref 12–28)
BUN: 50 mg/dL — ABNORMAL HIGH (ref 8–27)
CO2: 15 mmol/L — ABNORMAL LOW (ref 20–29)
Calcium: 9.2 mg/dL (ref 8.7–10.3)
Chloride: 114 mmol/L — ABNORMAL HIGH (ref 96–106)
Creatinine, Ser: 1.94 mg/dL — ABNORMAL HIGH (ref 0.57–1.00)
Glucose: 89 mg/dL (ref 70–99)
Potassium: 5.6 mmol/L — ABNORMAL HIGH (ref 3.5–5.2)
Sodium: 142 mmol/L (ref 134–144)
eGFR: 27 mL/min/1.73 — ABNORMAL LOW (ref >59)

## 2023-12-09 ENCOUNTER — Ambulatory Visit: Payer: Self-pay | Admitting: Cardiology

## 2023-12-09 DIAGNOSIS — I10 Essential (primary) hypertension: Secondary | ICD-10-CM

## 2023-12-09 DIAGNOSIS — C3412 Malignant neoplasm of upper lobe, left bronchus or lung: Secondary | ICD-10-CM | POA: Diagnosis not present

## 2023-12-09 DIAGNOSIS — R911 Solitary pulmonary nodule: Secondary | ICD-10-CM | POA: Diagnosis not present

## 2023-12-10 DIAGNOSIS — Z800 Family history of malignant neoplasm of digestive organs: Secondary | ICD-10-CM | POA: Diagnosis not present

## 2023-12-10 DIAGNOSIS — R911 Solitary pulmonary nodule: Secondary | ICD-10-CM | POA: Diagnosis not present

## 2023-12-10 DIAGNOSIS — J4540 Moderate persistent asthma, uncomplicated: Secondary | ICD-10-CM | POA: Diagnosis not present

## 2023-12-10 DIAGNOSIS — Z7951 Long term (current) use of inhaled steroids: Secondary | ICD-10-CM | POA: Diagnosis not present

## 2023-12-10 DIAGNOSIS — G35D Multiple sclerosis, unspecified: Secondary | ICD-10-CM | POA: Diagnosis not present

## 2023-12-10 DIAGNOSIS — Z79899 Other long term (current) drug therapy: Secondary | ICD-10-CM | POA: Diagnosis not present

## 2023-12-10 DIAGNOSIS — Z7982 Long term (current) use of aspirin: Secondary | ICD-10-CM | POA: Diagnosis not present

## 2023-12-10 DIAGNOSIS — J4489 Other specified chronic obstructive pulmonary disease: Secondary | ICD-10-CM | POA: Diagnosis not present

## 2023-12-10 DIAGNOSIS — I10 Essential (primary) hypertension: Secondary | ICD-10-CM | POA: Diagnosis not present

## 2023-12-10 MED ORDER — OLMESARTAN MEDOXOMIL 5 MG PO TABS: 10.0000 mg | ORAL_TABLET | Freq: Every day | ORAL | 3 refills | Status: DC

## 2023-12-15 ENCOUNTER — Telehealth: Payer: Self-pay | Admitting: Cardiovascular Disease

## 2023-12-15 NOTE — Telephone Encounter (Signed)
 RN called pharmacy to clarify  patient prescription.   Per pharmacy tech, patient has 2 prescription currently at pharmacy  Rock Ruth NP- [facility] - sent Rx for Olmesartan   40 mg daily - this medication was  picked up 10/25 /25    another  Rx was e-sent  by L. Elsie RN for Dr Kate 10 /23/25 - d/c ing previous dose 20 mg  tablet  and change to Olmesartan  10 mg (take 2 tablets of 5 mg  to equal 10 mg ).  Per pharmacy tech  this Rx of 10 mg ( 5 mg x 2) has not been picked up .

## 2023-12-15 NOTE — Telephone Encounter (Signed)
 RN called patient .  RN  informed patient of  why she received the   the 40 mg tablets   Patient states she had 20 mg tablets and taking a half tablet been splitting the tablet to equal 10 mg since initial conversation on 10 /23/25 . She states she will continue  taking 10 mg  Olmesartan ( 1/2 of 20 mg ) ( this is prescription she already has) and  come tomorrow for BMP.   RN informed patient message will be sent to Dr Kate for review,.   She states she will be having a procedure on her lungs in the near future.

## 2023-12-15 NOTE — Telephone Encounter (Addendum)
 Spoke to patient she states she received a Rx from pharmacy patient states her friend picked her medication for her this weekend. - per patient,medication states Olmesartan  40 mg - refill was on 10 /21/25   (  the prescriber Rock Ruth NP ) .     Patient  received a call on 12/10/23  from  [facility] office reducing Olmesartan  to 10 mg  due  lab  results received by Dr Kate     Per note,  patient is to reduce  to Olmesartan  to 10 mg  daily  and have repeat  BMP  in one week

## 2023-12-15 NOTE — Telephone Encounter (Signed)
 Pt c/o medication issue:  1. Name of Medication:  olmesartan  (BENICAR ) 5 MG tablet  2. How are you currently taking this medication (dosage and times per day)?   3. Are you having a reaction (difficulty breathing--STAT)?   4. What is your medication issue?   Patient says dose from the pharmacy was for 40 MG instead of 5 MG. Patient would like to clarify correct dose.

## 2023-12-16 ENCOUNTER — Other Ambulatory Visit: Payer: Self-pay

## 2023-12-16 DIAGNOSIS — I10 Essential (primary) hypertension: Secondary | ICD-10-CM

## 2023-12-17 ENCOUNTER — Ambulatory Visit: Payer: Self-pay | Admitting: Cardiology

## 2023-12-17 LAB — BASIC METABOLIC PANEL WITH GFR
BUN/Creatinine Ratio: 25 (ref 12–28)
BUN: 40 mg/dL — ABNORMAL HIGH (ref 8–27)
CO2: 20 mmol/L (ref 20–29)
Calcium: 9.1 mg/dL (ref 8.7–10.3)
Chloride: 111 mmol/L — ABNORMAL HIGH (ref 96–106)
Creatinine, Ser: 1.59 mg/dL — ABNORMAL HIGH (ref 0.57–1.00)
Glucose: 91 mg/dL (ref 70–99)
Potassium: 5 mmol/L (ref 3.5–5.2)
Sodium: 147 mmol/L — ABNORMAL HIGH (ref 134–144)
eGFR: 34 mL/min/1.73 — ABNORMAL LOW (ref >59)

## 2023-12-18 DIAGNOSIS — C3412 Malignant neoplasm of upper lobe, left bronchus or lung: Secondary | ICD-10-CM | POA: Diagnosis not present

## 2023-12-18 NOTE — Progress Notes (Signed)
 I called [facility] and requested that MRI Brain results be resulted by Tuesday morning's appointment 12/22/23.

## 2023-12-21 ENCOUNTER — Other Ambulatory Visit: Payer: Self-pay | Admitting: Internal Medicine

## 2023-12-21 DIAGNOSIS — R809 Proteinuria, unspecified: Secondary | ICD-10-CM | POA: Diagnosis not present

## 2023-12-21 DIAGNOSIS — R600 Localized edema: Secondary | ICD-10-CM | POA: Diagnosis not present

## 2023-12-21 DIAGNOSIS — N1832 Chronic kidney disease, stage 3b: Secondary | ICD-10-CM | POA: Diagnosis not present

## 2023-12-21 DIAGNOSIS — J811 Chronic pulmonary edema: Secondary | ICD-10-CM | POA: Diagnosis not present

## 2023-12-21 DIAGNOSIS — N179 Acute kidney failure, unspecified: Secondary | ICD-10-CM | POA: Diagnosis not present

## 2023-12-21 DIAGNOSIS — I129 Hypertensive chronic kidney disease with stage 1 through stage 4 chronic kidney disease, or unspecified chronic kidney disease: Secondary | ICD-10-CM | POA: Diagnosis not present

## 2023-12-22 DIAGNOSIS — C3412 Malignant neoplasm of upper lobe, left bronchus or lung: Secondary | ICD-10-CM | POA: Diagnosis not present

## 2023-12-23 ENCOUNTER — Ambulatory Visit
Admission: RE | Admit: 2023-12-23 | Discharge: 2023-12-23 | Disposition: A | Discharge status: Home / Self Care | Source: Ambulatory Visit | Attending: Internal Medicine | Admitting: Internal Medicine

## 2023-12-23 DIAGNOSIS — R600 Localized edema: Secondary | ICD-10-CM | POA: Diagnosis not present

## 2023-12-23 LAB — LAB REPORT - SCANNED: EGFR: 27

## 2023-12-25 DIAGNOSIS — C3412 Malignant neoplasm of upper lobe, left bronchus or lung: Secondary | ICD-10-CM | POA: Diagnosis not present

## 2023-12-25 DIAGNOSIS — R911 Solitary pulmonary nodule: Secondary | ICD-10-CM | POA: Diagnosis not present

## 2023-12-30 DIAGNOSIS — R32 Unspecified urinary incontinence: Secondary | ICD-10-CM | POA: Diagnosis not present

## 2023-12-30 DIAGNOSIS — J439 Emphysema, unspecified: Secondary | ICD-10-CM | POA: Diagnosis not present

## 2023-12-30 DIAGNOSIS — I70209 Unspecified atherosclerosis of native arteries of extremities, unspecified extremity: Secondary | ICD-10-CM | POA: Diagnosis not present

## 2023-12-30 DIAGNOSIS — N1832 Chronic kidney disease, stage 3b: Secondary | ICD-10-CM | POA: Diagnosis not present

## 2023-12-30 DIAGNOSIS — M1990 Unspecified osteoarthritis, unspecified site: Secondary | ICD-10-CM | POA: Diagnosis not present

## 2023-12-30 DIAGNOSIS — F325 Major depressive disorder, single episode, in full remission: Secondary | ICD-10-CM | POA: Diagnosis not present

## 2023-12-30 DIAGNOSIS — E785 Hyperlipidemia, unspecified: Secondary | ICD-10-CM | POA: Diagnosis not present

## 2023-12-30 DIAGNOSIS — I700 Atherosclerosis of aorta: Secondary | ICD-10-CM | POA: Diagnosis not present

## 2023-12-30 DIAGNOSIS — I701 Atherosclerosis of renal artery: Secondary | ICD-10-CM | POA: Diagnosis not present

## 2023-12-31 DIAGNOSIS — R600 Localized edema: Secondary | ICD-10-CM | POA: Diagnosis not present

## 2023-12-31 DIAGNOSIS — N179 Acute kidney failure, unspecified: Secondary | ICD-10-CM | POA: Diagnosis not present

## 2023-12-31 DIAGNOSIS — J811 Chronic pulmonary edema: Secondary | ICD-10-CM | POA: Diagnosis not present

## 2023-12-31 DIAGNOSIS — I129 Hypertensive chronic kidney disease with stage 1 through stage 4 chronic kidney disease, or unspecified chronic kidney disease: Secondary | ICD-10-CM | POA: Diagnosis not present

## 2023-12-31 DIAGNOSIS — A530 Latent syphilis, unspecified as early or late: Secondary | ICD-10-CM | POA: Diagnosis not present

## 2023-12-31 DIAGNOSIS — R809 Proteinuria, unspecified: Secondary | ICD-10-CM | POA: Diagnosis not present

## 2024-01-01 ENCOUNTER — Other Ambulatory Visit (HOSPITAL_COMMUNITY): Payer: Self-pay | Admitting: Internal Medicine

## 2024-01-01 DIAGNOSIS — R0989 Other specified symptoms and signs involving the circulatory and respiratory systems: Secondary | ICD-10-CM

## 2024-01-01 DIAGNOSIS — R600 Localized edema: Secondary | ICD-10-CM

## 2024-01-01 DIAGNOSIS — N179 Acute kidney failure, unspecified: Secondary | ICD-10-CM

## 2024-01-01 DIAGNOSIS — E785 Hyperlipidemia, unspecified: Secondary | ICD-10-CM

## 2024-01-01 DIAGNOSIS — I2542 Coronary artery dissection: Secondary | ICD-10-CM

## 2024-01-01 DIAGNOSIS — J810 Acute pulmonary edema: Secondary | ICD-10-CM

## 2024-01-01 DIAGNOSIS — I739 Peripheral vascular disease, unspecified: Secondary | ICD-10-CM

## 2024-01-01 DIAGNOSIS — I129 Hypertensive chronic kidney disease with stage 1 through stage 4 chronic kidney disease, or unspecified chronic kidney disease: Secondary | ICD-10-CM

## 2024-01-01 DIAGNOSIS — R809 Proteinuria, unspecified: Secondary | ICD-10-CM

## 2024-01-02 DIAGNOSIS — N281 Cyst of kidney, acquired: Secondary | ICD-10-CM | POA: Diagnosis not present

## 2024-01-02 DIAGNOSIS — R0600 Dyspnea, unspecified: Secondary | ICD-10-CM | POA: Diagnosis not present

## 2024-01-02 DIAGNOSIS — J439 Emphysema, unspecified: Secondary | ICD-10-CM | POA: Diagnosis not present

## 2024-01-02 DIAGNOSIS — R7989 Other specified abnormal findings of blood chemistry: Secondary | ICD-10-CM | POA: Diagnosis not present

## 2024-01-02 DIAGNOSIS — C3490 Malignant neoplasm of unspecified part of unspecified bronchus or lung: Secondary | ICD-10-CM | POA: Diagnosis not present

## 2024-01-02 DIAGNOSIS — K5730 Diverticulosis of large intestine without perforation or abscess without bleeding: Secondary | ICD-10-CM | POA: Diagnosis not present

## 2024-01-02 DIAGNOSIS — M7989 Other specified soft tissue disorders: Secondary | ICD-10-CM | POA: Diagnosis not present

## 2024-01-02 DIAGNOSIS — J431 Panlobular emphysema: Secondary | ICD-10-CM | POA: Diagnosis not present

## 2024-01-02 DIAGNOSIS — E7849 Other hyperlipidemia: Secondary | ICD-10-CM | POA: Diagnosis not present

## 2024-01-02 DIAGNOSIS — D631 Anemia in chronic kidney disease: Secondary | ICD-10-CM | POA: Diagnosis not present

## 2024-01-02 DIAGNOSIS — C3412 Malignant neoplasm of upper lobe, left bronchus or lung: Secondary | ICD-10-CM | POA: Diagnosis not present

## 2024-01-02 DIAGNOSIS — J9811 Atelectasis: Secondary | ICD-10-CM | POA: Diagnosis not present

## 2024-01-02 DIAGNOSIS — R062 Wheezing: Secondary | ICD-10-CM | POA: Diagnosis not present

## 2024-01-02 DIAGNOSIS — R0602 Shortness of breath: Secondary | ICD-10-CM | POA: Diagnosis not present

## 2024-01-02 DIAGNOSIS — N179 Acute kidney failure, unspecified: Secondary | ICD-10-CM | POA: Diagnosis not present

## 2024-01-02 DIAGNOSIS — I129 Hypertensive chronic kidney disease with stage 1 through stage 4 chronic kidney disease, or unspecified chronic kidney disease: Secondary | ICD-10-CM | POA: Diagnosis not present

## 2024-01-02 DIAGNOSIS — E872 Acidosis, unspecified: Secondary | ICD-10-CM | POA: Diagnosis not present

## 2024-01-02 DIAGNOSIS — R918 Other nonspecific abnormal finding of lung field: Secondary | ICD-10-CM | POA: Diagnosis not present

## 2024-01-02 DIAGNOSIS — I10 Essential (primary) hypertension: Secondary | ICD-10-CM | POA: Diagnosis not present

## 2024-01-02 DIAGNOSIS — J441 Chronic obstructive pulmonary disease with (acute) exacerbation: Secondary | ICD-10-CM | POA: Diagnosis not present

## 2024-01-02 DIAGNOSIS — N1832 Chronic kidney disease, stage 3b: Secondary | ICD-10-CM | POA: Diagnosis not present

## 2024-01-02 DIAGNOSIS — J9601 Acute respiratory failure with hypoxia: Secondary | ICD-10-CM | POA: Diagnosis not present

## 2024-01-02 DIAGNOSIS — Z90710 Acquired absence of both cervix and uterus: Secondary | ICD-10-CM | POA: Diagnosis not present

## 2024-01-02 DIAGNOSIS — R9431 Abnormal electrocardiogram [ECG] [EKG]: Secondary | ICD-10-CM | POA: Diagnosis not present

## 2024-01-02 DIAGNOSIS — J810 Acute pulmonary edema: Secondary | ICD-10-CM | POA: Diagnosis not present

## 2024-01-13 ENCOUNTER — Ambulatory Visit: Admitting: Pharmacist

## 2024-01-21 DIAGNOSIS — C3412 Malignant neoplasm of upper lobe, left bronchus or lung: Secondary | ICD-10-CM | POA: Diagnosis not present

## 2024-01-23 DIAGNOSIS — R9431 Abnormal electrocardiogram [ECG] [EKG]: Secondary | ICD-10-CM | POA: Diagnosis not present

## 2024-01-23 DIAGNOSIS — J441 Chronic obstructive pulmonary disease with (acute) exacerbation: Secondary | ICD-10-CM | POA: Diagnosis not present

## 2024-01-26 DIAGNOSIS — R911 Solitary pulmonary nodule: Secondary | ICD-10-CM | POA: Diagnosis not present

## 2024-01-26 DIAGNOSIS — J449 Chronic obstructive pulmonary disease, unspecified: Secondary | ICD-10-CM | POA: Diagnosis not present

## 2024-01-26 DIAGNOSIS — J4540 Moderate persistent asthma, uncomplicated: Secondary | ICD-10-CM | POA: Diagnosis not present

## 2024-02-14 NOTE — Progress Notes (Unsigned)
 " Cardiology Office Note:    Date:  02/16/2024   ID:  JENESYS CASSEUS, DOB [birth year], MRN [id number]  PCP:  Yolande Toribio MATSU, MD  Cardiologist:  None  Electrophysiologist:  None   Referring MD: Yolande Toribio MATSU, MD   Chief Complaint  Patient presents with   Congestive Heart Failure    History of Present Illness:    Olivia Cook is a 73 y.o. female with a hx of COPD, hypertension, hyperlipidemia, hypothyroidism who presents for follow-up.  She was referred by Dr. Yolande for evaluation of CAD,seen 07/07/2023.  On CT chest 04/01/2023 noted to have severe  three-vessel coronary calcifications.  Denies any chest pain, lightheadedness, syncope, lower extremity edema, or palpitations.  She does report she has been having shortness of breath, especially with going up and down stairs.  Reports pain in legs with walking upstairs.  She had some lower extremity edema when started diltiazem but has improved.  She denies any lightheadedness or syncope.  She smoked for 50 years, about 0.5 packs/day at peak but now down to 2 to 3 cigarettes/day.  Family history includes father CABG in 79s and mother had CHF.  Echocardiogram 09/07/2023 showed normal biventricular function, no significant valvular disease.  Lexiscan  Myoview  10/07/2023 showed normal perfusion, LVEF 74%, severe coronary calcifications.  ABIs 08/2023 were 0.42 on right, 0.52 on left.  Lower extremity duplex showed monophasic waveforms in bilateral CFA consistent with inflow disease.  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  Since last clinic visit, she reports she has been feeling very short of breath.  States that has been gradually worsening and now feels short of breath all the time.  Has had worsening lower extremity edema and her weight is up.  She saw her nephrologist yesterday and was told to increase her Lasix to 40 mg daily for next 3 days.  She denies any chest  pain.  Wt Readings from Last 3 Encounters:  02/16/24 154 lb (69.9 kg)  10/28/23 148 lb 9.6 oz (67.4 kg)  10/20/23 148 lb (67.1 kg)      Past Medical History:  Diagnosis Date   Allergy    SEASONAL   Anxiety    Arthritis    Emphysema of lung (HCC)    Hyperlipidemia    Hypertension    Thyroid  disease     Past Surgical History:  Procedure Laterality Date   COLONOSCOPY     POLYPECTOMY     VAGINAL HYSTERECTOMY N/A 1978    Current Medications: Current Meds  Medication Sig   acetaminophen (TYLENOL) 500 MG tablet Take 500 mg by mouth every 6 (six) hours as needed.   albuterol (PROVENTIL) (2.5 MG/3ML) 0.083% nebulizer solution Take 2.5 mg by nebulization every 6 (six) hours as needed.   amLODipine  (NORVASC ) 10 MG tablet Take 1 tablet (10 mg total) by mouth daily.   aspirin EC 81 MG tablet Take 81 mg by mouth daily.   atorvastatin (LIPITOR) 20 MG tablet Take 20 mg by mouth daily.   bisoprolol (ZEBETA) 5 MG tablet Take by mouth. (Patient taking differently: Take 5 mg by mouth 2 (two) times daily.)   Blood Pressure Monitoring (OMRON 3 SERIES BP MONITOR) DEVI Use to check blood pressure daily.   budesonide-formoterol (SYMBICORT) 160-4.5 MCG/ACT inhaler Inhale 2 puffs into the lungs 2 (two) times daily.   buPROPion (WELLBUTRIN SR) 150 MG 12 hr tablet Take 150 mg by mouth daily.  furosemide (LASIX) 20 MG tablet Take 20 mg by mouth daily.   hydrALAZINE  (APRESOLINE ) 25 MG tablet Take 1 tablet (25 mg total) by mouth 3 (three) times daily.   levothyroxine (SYNTHROID) 100 MCG tablet Take by mouth.   Magnesium  Oxide -Mg Supplement 400 MG CAPS Take 400 mg by mouth daily.   predniSONE (DELTASONE) 10 MG tablet Take 10 mg by mouth daily with breakfast.     Allergies:   Patient has no known allergies.   Social History   Socioeconomic History   Marital status: Widowed    Spouse name: Not on file   Number of children: Not on file   Years of education: Not on file   Highest education  level: Not on file  Occupational History   Not on file  Tobacco Use   Smoking status: Former    Types: Cigarettes   Smokeless tobacco: Never   Tobacco comments:    SMOKES SOME DAYS, NOT DAILY   Vaping Use   Vaping status: Never Used  Substance and Sexual Activity   Alcohol  use: No   Drug use: No   Sexual activity: Not on file  Other Topics Concern   Not on file  Social History Narrative   Not on file   Social Drivers of Health   Tobacco Use: Medium Risk (02/16/2024)   Patient History    Smoking Tobacco Use: Former    Smokeless Tobacco Use: Never    Passive Exposure: Not on Actuary Strain: Not on file  Food Insecurity: No Food Insecurity (01/02/2024)   [facility]   Epic    Within the past 12 months, you worried that your food would run out before you got the money to buy more.: Never true    Within the past 12 months, the food you bought just didn't last and you didn't have money to get more.: Never true  Transportation Needs: No Transportation Needs (01/02/2024)   Received from [facility]    In the past 12 months, has lack of transportation kept you from medical appointments or from getting medications?: No    In the past 12 months, has lack of transportation kept you from meetings, work, or from getting things needed for daily living?: No  Physical Activity: Not on file  Stress: No Stress Concern Present (01/02/2024)   Received from [facility] Occup[facility] - Occupational Stress Questionnaire    Do you feel stress - tense, restless, nervous, or anxious, or unable to sleep at night because your mind is troubled all the time - these days?: Not at all  Social Connections: Not on file  Depression (PHQ2-9): Not on file  Alcohol  Screen: Not on file  Housing: Low Risk (01/02/2024)   Received from [facility]    In the last 12 months, was there a time when you were not able to pay the  mortgage or rent on time?: No    In the past 12 months, how many times have you moved where you were living?: 0    At any time in the past 12 months, were you homeless or living in a shelter (including now)?: No  Utilities: Not At Risk (01/02/2024)   Received from [facility]    In the past 12 months has the electric, gas, oil, or water company threatened to shut off services in your home?: No  Health Literacy: Not  on file     Family History: The patient's family history includes Colon cancer in her sister. There is no history of Esophageal cancer, Rectal cancer, Stomach cancer, or Colon polyps.  ROS:   Please see the history of present illness.     All other systems reviewed and are negative.  EKGs/Labs/Other Studies Reviewed:    The following studies were reviewed today:   EKG:   07/15/2023: Normal sinus rhythm, rate 69, less than 1 mm ST depressions in leads II, aVF and V4-6, nonspecific T wave flattening 02/16/24: Normal sinus rhythm, rate 83, no ST abnormalities  Recent Labs: 07/15/2023: ALT 14; Hemoglobin 12.7; Platelets 310 11/23/2023: Magnesium  2.1 12/16/2023: BUN 40; Creatinine, Ser 1.59; Potassium 5.0; Sodium 147  Recent Lipid Panel    Component Value Date/Time   CHOL 164 10/20/2023 1632   TRIG 348 (H) 10/20/2023 1632   HDL 39 (L) 10/20/2023 1632   CHOLHDL 4.2 10/20/2023 1632   LDLCALC 70 10/20/2023 1632    Physical Exam:    VS:  BP (!) 160/50 (BP Location: Right Arm, Patient Position: Sitting, Cuff Size: Normal)   Pulse 81   Ht 4' 10 (1.473 m)   Wt 154 lb (69.9 kg)   SpO2 91%   BMI 32.19 kg/m     Wt Readings from Last 3 Encounters:  02/16/24 154 lb (69.9 kg)  10/28/23 148 lb 9.6 oz (67.4 kg)  10/20/23 148 lb (67.1 kg)     GEN:   in no acute distress HEENT: Normal NECK: + JVD; right carotid bruit CARDIAC: RRR, no murmurs, rubs, gallops RESPIRATORY: Bibasilar crackles ABDOMEN: Soft, non-tender, non-distended MUSCULOSKELETAL: 2+ lower  extremity edema; No deformity  SKIN: Warm and dry NEUROLOGIC:  Alert and oriented x 3 PSYCHIATRIC:  Normal affect   ASSESSMENT:    1. Acute on chronic diastolic heart failure (HCC)   2. Essential hypertension   3. Coronary artery disease involving native coronary artery of native heart without angina pectoris      PLAN:    Acute on chronic diastolic heart failure: She is significantly volume overloaded on exam and reports worsening dyspnea.  She is tachypneic and mildly hypoxic in clinic today.  Recommend going to ED for admission for IV diuresis.  She is agreeable with this plan.  Recommended going across the street to [facility] but she declines, wants to go to [facility] in [place].  She will be taken to [facility] ED  CAD: Severe multivessel coronary calcifications noted on CT chest 03/2023.  She denies chest pain but is reporting dyspnea on exertion that could represent anginal equivalent.  Echocardiogram 09/07/2023 showed normal biventricular function, no significant valvular disease.  Lexiscan  Myoview  10/07/2023 showed normal perfusion, LVEF 74%, severe coronary calcifications.  Denies any exertional symptoms - Continue aspirin 81 mg daily - Continue atorvastatin 20 mg daily  PAD: she reported having leg pain. ABIs 08/2023 were 0.42 on right, 0.52 on left.  Lower extremity duplex showed monophasic waveforms in bilateral CFA consistent with inflow disease.  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 -Referred to Dr Court for evaluation for peripheral intervention, monitoring for now given claudication not affecting activities of daily living -Continue aspirin, statin  Hypertension: On bisoprolol 5 mg twice daily,  olmesartan  20 mg daily, amlodipine  10 mg daily.    Hyperlipidemia: On atorvastatin 20 mg daily.  LDL 70 on 10/20/2023  Carotid stenosis: Mild (1-39% stenosis) in bilateral carotid  arteries on duplex  10/2018.  Will monitor  Tobacco use: Counseled on risk of tobacco use and cessation strongly encouraged  CKD stage IIIb: Creatinine 2.0 on 01/23/2024.  Non-small cell lung cancer: Stage I, she is undergoing radiation  RTC in 1 month   Medication Adjustments/Labs and Tests Ordered: Current medicines are reviewed at length with the patient today.  Concerns regarding medicines are outlined above.  Orders Placed This Encounter  Procedures   EKG 12-Lead   No orders of the defined types were placed in this encounter.   Patient Instructions  PLEASE GO TO THE EMERGENCY DEPARTMENT/ROOM TO GET CHECKED OUT     Medication Instructions:  Your physician recommends that you continue on your current medications as directed. Please refer to the Current Medication list given to you today. *If you need a refill on your cardiac medications before your next appointment, please call your pharmacy*  Lab Work: NONE ORDERED If you have labs (blood work) drawn today and your tests are completely normal, you will receive your results only by: MyChart Message (if you have MyChart) OR A paper copy in the mail If you have any lab test that is abnormal or we need to change your treatment, we will call you to review the results.  Testing/Procedures: NONE ORDERED  Follow-Up: At [facility], you and your health needs are our priority.  As part of our continuing mission to provide you with exceptional heart care, our providers are all part of one team.  This team includes your primary Cardiologist (physician) and Advanced Practice Providers or APPs (Physician Assistants and Nurse Practitioners) who all work together to provide you with the care you need, when you need it.  Your next appointment:   2 week(s)  Provider:   Lonni Nanas, MD   We recommend signing up for the patient portal called MyChart.  Sign up information is provided on this After Visit Summary.  MyChart is used to  connect with patients for Virtual Visits (Telemedicine).  Patients are able to view lab/test results, encounter notes, upcoming appointments, etc.  Non-urgent messages can be sent to your provider as well.   To learn more about what you can do with MyChart, go to [email or web address].   Other Instructions            Signed, Lonni LITTIE Nanas, MD  02/16/2024 3:51 PM    [facility] "

## 2024-02-16 ENCOUNTER — Encounter: Payer: Self-pay | Admitting: Cardiology

## 2024-02-16 ENCOUNTER — Ambulatory Visit: Attending: Cardiology | Admitting: Cardiology

## 2024-02-16 VITALS — BP 160/50 | HR 81 | Ht <= 58 in | Wt 154.0 lb

## 2024-02-16 DIAGNOSIS — I10 Essential (primary) hypertension: Secondary | ICD-10-CM

## 2024-02-16 DIAGNOSIS — I5033 Acute on chronic diastolic (congestive) heart failure: Secondary | ICD-10-CM

## 2024-02-16 DIAGNOSIS — I2510 Atherosclerotic heart disease of native coronary artery without angina pectoris: Secondary | ICD-10-CM | POA: Diagnosis not present

## 2024-02-16 NOTE — Patient Instructions (Signed)
 PLEASE GO TO THE EMERGENCY DEPARTMENT/ROOM TO GET CHECKED OUT     Medication Instructions:  Your physician recommends that you continue on your current medications as directed. Please refer to the Current Medication list given to you today. *If you need a refill on your cardiac medications before your next appointment, please call your pharmacy*  Lab Work: NONE ORDERED If you have labs (blood work) drawn today and your tests are completely normal, you will receive your results only by: MyChart Message (if you have MyChart) OR A paper copy in the mail If you have any lab test that is abnormal or we need to change your treatment, we will call you to review the results.  Testing/Procedures: NONE ORDERED  Follow-Up: At [facility], you and your health needs are our priority.  As part of our continuing mission to provide you with exceptional heart care, our providers are all part of one team.  This team includes your primary Cardiologist (physician) and Advanced Practice Providers or APPs (Physician Assistants and Nurse Practitioners) who all work together to provide you with the care you need, when you need it.  Your next appointment:   2 week(s)  Provider:   Lonni Nanas, MD   We recommend signing up for the patient portal called MyChart.  Sign up information is provided on this After Visit Summary.  MyChart is used to connect with patients for Virtual Visits (Telemedicine).  Patients are able to view lab/test results, encounter notes, upcoming appointments, etc.  Non-urgent messages can be sent to your provider as well.   To learn more about what you can do with MyChart, go to [email or web address].   Other Instructions

## 2024-02-17 ENCOUNTER — Telehealth (HOSPITAL_COMMUNITY): Payer: Self-pay | Admitting: Pharmacy Technician

## 2024-02-17 ENCOUNTER — Other Ambulatory Visit (HOSPITAL_COMMUNITY): Payer: Self-pay | Admitting: Internal Medicine

## 2024-02-17 DIAGNOSIS — D509 Iron deficiency anemia, unspecified: Secondary | ICD-10-CM

## 2024-02-17 DIAGNOSIS — D649 Anemia, unspecified: Secondary | ICD-10-CM | POA: Insufficient documentation

## 2024-02-17 NOTE — Telephone Encounter (Signed)
 Erroneous encounter

## 2024-02-23 NOTE — Progress Notes (Signed)
 CARE COORDINATOR CONTACT WITH PATIENT/CAREGIVER AFTER HOSPITAL DISCHARGE   Patient contacted.  Encouraged to schedule Hospital follow up appointment with their Non NH PCP.  Attempted to contact patient. Unable to reach. Left voicemail. MyChart sent.

## 2024-02-26 ENCOUNTER — Other Ambulatory Visit: Payer: Self-pay | Admitting: Student

## 2024-02-26 DIAGNOSIS — Z01818 Encounter for other preprocedural examination: Secondary | ICD-10-CM

## 2024-02-28 NOTE — H&P (Signed)
 "    Chief Complaint: Patient was seen in consultation today for worsening AKI and proteinuria, with consideration for random renal biopsy.  Referring Provider(s): Dr. Manuelita Barters, MD   Supervising Physician: Jenna Hacker  Patient Status: [facility] - Out-pt  Patient is Full Code  History of Present Illness: Olivia Cook is a 74 y.o. 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  Per Dr. Era office note dated 12/28/2023 and external charts: []  2019 CR 0.9 06/2023 CR 1.08 > 07/2023 1.2-1.3 > 09/2023 1.5 > 10/2023 1.5-1.6 > 10/25 1.8 > 1.9 > 1.6.  No UA's historically. She underwent CTA LE 10/14/2023: severe R inflow disease, L moderate disease. 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 Elisabeth.   [...] AKI (acute kidney injury) (N17.9) Today's impression:- limited historic data but looks like declining GFR really just in the past 6 months - 3+ protein on UA today and edema raises concern for nephrotic syndrome - doing full w/u today with close f/u next week to decide on kidney biopsy - Recent imaging shows no kidney pathology - do not think we need to repeat at this time [...]  Patient is undergoing radiation therapy for LUL lung cancer, and initially planned to postpone renal biopsy to initiate radiation.  However, patient was also hospitalized from 11/15-11/19 and again from 12/30-1/4 with complaints of shortness of breath.  On most recent admission, patient was treated for right middle lobe pneumonia and COPD exacerbation.  Creatinine noted to be 2.11 on 1/3.  Interventional Radiology was requested for non-target renal biopsy. Patient is scheduled for same in IR today.   Patient is alert and laying in bed, calm.  Patient is  currently without any significant complaints. She feels well. Patient denies any fevers, headache, chest pain, SOB, cough, abdominal pain, nausea, vomiting or bleeding.     Past Medical History:  Diagnosis Date   Allergy    SEASONAL   Anxiety    Arthritis    Emphysema of lung (HCC)    Hyperlipidemia    Hypertension    Thyroid  disease     Past Surgical History:  Procedure Laterality Date   COLONOSCOPY     POLYPECTOMY     VAGINAL HYSTERECTOMY N/A 1978    Allergies: Patient has no known allergies.  Medications: Prior to Admission medications  Medication Sig Start Date End Date Taking? Authorizing Provider  acetaminophen (TYLENOL) 500 MG tablet Take 500 mg by mouth every 6 (six) hours as needed.    [provider]  albuterol (PROVENTIL) (2.5 MG/3ML) 0.083% nebulizer solution Take 2.5 mg by nebulization every 6 (six) hours as needed.    [provider]  amLODipine  (NORVASC ) 10 MG tablet Take 1 tablet (10 mg total) by mouth daily. 08/12/23 02/16/24  Kate Lonni CROME, MD  aspirin EC 81 MG tablet Take 81 mg by mouth daily.    [provider]  atorvastatin (LIPITOR) 20 MG tablet Take 20 mg by mouth daily. 06/20/21   [provider]  bisoprolol (ZEBETA) 5 MG tablet Take by mouth. Patient taking differently: Take 5 mg by mouth 2 (two) times daily. 06/28/21   [provider]  Blood Pressure Monitoring (OMRON 3 SERIES BP MONITOR) DEVI Use to check blood pressure daily. 12/07/23  Kate Lonni CROME, MD  budesonide-formoterol [facility]) 160-4.5 MCG/ACT inhaler Inhale 2 puffs into the lungs 2 (two) times daily.    [provider]  buPROPion (WELLBUTRIN SR) 150 MG 12 hr tablet Take 150 mg by mouth daily.    [provider]  cyclobenzaprine (FLEXERIL) 10 MG tablet Take 10 mg by mouth at bedtime. Patient not taking: Reported on 02/16/2024    [provider]  furosemide (LASIX) 20 MG tablet Take 20 mg by mouth  daily.    [provider]  hydrALAZINE  (APRESOLINE ) 25 MG tablet Take 1 tablet (25 mg total) by mouth 3 (three) times daily. 12/07/23 03/06/24  Kate Lonni CROME, MD  levothyroxine (SYNTHROID) 100 MCG tablet Take by mouth. 05/30/21   [provider]  Magnesium  Oxide -Mg Supplement 400 MG CAPS Take 400 mg by mouth daily. 07/21/23   Kate Lonni CROME, MD  olmesartan  (BENICAR ) 5 MG tablet Take 2 tablets (10 mg total) by mouth daily. Patient not taking: Reported on 02/16/2024 12/10/23   Kate Lonni CROME, MD  OVER THE COUNTER MEDICATION 1 tablet every morning. Patient not taking: Reported on 02/16/2024    [provider]  predniSONE (DELTASONE) 10 MG tablet Take 10 mg by mouth daily with breakfast. 02/08/24   [provider]     Family History  Problem Relation Age of Onset   Colon cancer Sister        DX'D IN HER LATE 50'S   Esophageal cancer Neg Hx    Rectal cancer Neg Hx    Stomach cancer Neg Hx    Colon polyps Neg Hx     Social History   Socioeconomic History   Marital status: Widowed    Spouse name: Not on file   Number of children: Not on file   Years of education: Not on file   Highest education level: Not on file  Occupational History   Not on file  Tobacco Use   Smoking status: Former    Types: Cigarettes   Smokeless tobacco: Never   Tobacco comments:    SMOKES SOME DAYS, NOT DAILY   Vaping Use   Vaping status: Never Used  Substance and Sexual Activity   Alcohol  use: No   Drug use: No   Sexual activity: Not on file  Other Topics Concern   Not on file  Social History Narrative   Not on file   Social Drivers of Health   Tobacco Use: High Risk (02/16/2024)   Received from [facility]   Patient History    Smoking Tobacco Use: Every Day    Smokeless Tobacco Use: Never    Passive Exposure: Not on file  Financial Resource Strain: Not on file  Food Insecurity: No Food Insecurity (02/19/2024)   Received from  [facility]   Epic    Within the past 12 months, you worried that your food would run out before you got the money to buy more.: Never true    Within the past 12 months, the food you bought just didn't last and you didn't have money to get more.: Never true  Transportation Needs: No Transportation Needs (02/19/2024)   Received from [facility]    In the past 12 months, has lack of transportation kept you from medical appointments or from getting medications?: No    In the past 12 months, has lack of transportation kept you from meetings, work, or from getting things needed for daily living?: No  Physical Activity:  Not on file  Stress: No Stress Concern Present (02/19/2024)   Received from [facility] of Occupational Health - Occupational Stress Questionnaire    Do you feel stress - tense, restless, nervous, or anxious, or unable to sleep at night because your mind is troubled all the time - these days?: Not at all  Social Connections: Not on file  Depression (PHQ2-9): Not on file  Alcohol  Screen: Not on file  Housing: Low Risk (02/19/2024)   Received from [facility]    In the last 12 months, was there a time when you were not able to pay the mortgage or rent on time?: No    In the past 12 months, how many times have you moved where you were living?: 0    At any time in the past 12 months, were you homeless or living in a shelter (including now)?: No  Utilities: Not At Risk (02/19/2024)   Received from [facility]    In the past 12 months has the electric, gas, oil, or water company threatened to shut off services in your home?: No  Health Literacy: Not on file     Review of Systems: A 12 point ROS discussed and pertinent positives are indicated in the HPI above.  All other systems are negative.  Vital Signs: BP (!) 148/57   Pulse 86   Temp 97.7 F (36.5 C) (Oral)   Resp 16   Ht 4' 10 (1.473 m)   Wt 142 lb (64.4 kg)   SpO2 96%    BMI 29.68 kg/m   Advance Care Plan: The advanced care place/surrogate decision maker was discussed at the time of visit and the patient did not wish to discuss or was not able to name a surrogate decision maker or provide an advance care plan.  Physical Exam Constitutional:      General: She is not in acute distress.    Appearance: Normal appearance.  HENT:     Mouth/Throat:     Mouth: Mucous membranes are moist.  Cardiovascular:     Rate and Rhythm: Regular rhythm. Tachycardia present.     Pulses: Normal pulses.     Heart sounds: Normal heart sounds.  Pulmonary:     Effort: Pulmonary effort is normal.     Breath sounds: Normal breath sounds.  Musculoskeletal:        General: Normal range of motion.     Cervical back: Normal range of motion.  Skin:    General: Skin is warm and dry.  Neurological:     Mental Status: She is alert and oriented to person, place, and time.  Psychiatric:        Mood and Affect: Mood normal.        Behavior: Behavior normal.        Thought Content: Thought content normal.        Judgment: Judgment normal.     Imaging: No results found.  Labs:  CBC: Recent Labs    07/15/23 1559  WBC 10.7  HGB 12.7  HCT 39.5  PLT 310    COAGS: No results for input(s): INR, APTT in the last 8760 hours.  BMP: Recent Labs    10/30/23 1338 11/23/23 1315 12/07/23 0900 12/16/23 1052  NA 142 143 142 147*  K 5.1 5.3* 5.6* 5.0  CL 107* 104 114* 111*  CO2 19* 18* 15* 20  GLUCOSE 84 90 89 91  BUN 37*  50* 50* 40*  CALCIUM 9.0 9.3 9.2 9.1  CREATININE 1.61* 1.82* 1.94* 1.59*    LIVER FUNCTION TESTS: Recent Labs    07/15/23 1559  BILITOT <0.2  AST 17  ALT 14  ALKPHOS 131*  PROT 6.5  ALBUMIN 3.9    TUMOR MARKERS: No results for input(s): AFPTM, CEA, CA199, CHROMGRNA in the last 8760 hours.  Assessment and Plan: Per Dr. Era office note dated 12/28/2023 and external charts: [...] AKI (acute kidney injury) (N17.9) Today's  impression:- limited historic data but looks like declining GFR really just in the past 6 months - 3+ protein on UA today and edema raises concern for nephrotic syndrome - doing full w/u today with close f/u next week to decide on kidney biopsy - Recent imaging shows no kidney pathology - do not think we need to repeat at this time [...]  Patient is undergoing radiation therapy for LUL lung cancer, and initially planned to postpone renal biopsy to initiate radiation.  However, patient was also hospitalized from 11/15-11/19 and again from 12/30-1/4 with complaints of shortness of breath.  On most recent admission, patient was treated for right middle lobe pneumonia and COPD exacerbation.  Creatinine noted to be 2.11 on 1/3.  Patient presents for scheduled nontargeted renal biopsy in IR today.  Patient has been NPO since midnight in anticipation of moderate sedation. All labs and medications are within acceptable parameters.  Last dose of aspirin on 02/24/2023. No known allergies on file.  Risks and benefits of renal biopsy was discussed with the patient and/or patient's family including, but not limited to bleeding, infection, damage to adjacent structures or low yield requiring additional tests.  All of the questions were answered and there is agreement to proceed.  Consent signed and in chart.     Thank you for allowing our service to participate in JULANN MCGILVRAY 's care.  Electronically Signed: Carlin DELENA Griffon, PA-C   02/29/2024, 7:43 AM      I spent a total of 30 Minutes in face to face in clinical consultation, greater than 50% of which was counseling/coordinating care for worsening AKI and proteinuria, with consideration for random renal biopsy.   "

## 2024-02-29 ENCOUNTER — Other Ambulatory Visit: Payer: Self-pay

## 2024-02-29 ENCOUNTER — Ambulatory Visit (HOSPITAL_COMMUNITY)
Admission: RE | Admit: 2024-02-29 | Discharge: 2024-02-29 | Disposition: A | Source: Ambulatory Visit | Attending: Internal Medicine | Admitting: Internal Medicine

## 2024-02-29 DIAGNOSIS — I129 Hypertensive chronic kidney disease with stage 1 through stage 4 chronic kidney disease, or unspecified chronic kidney disease: Secondary | ICD-10-CM | POA: Diagnosis not present

## 2024-02-29 DIAGNOSIS — J810 Acute pulmonary edema: Secondary | ICD-10-CM

## 2024-02-29 DIAGNOSIS — R600 Localized edema: Secondary | ICD-10-CM | POA: Insufficient documentation

## 2024-02-29 DIAGNOSIS — I2542 Coronary artery dissection: Secondary | ICD-10-CM | POA: Insufficient documentation

## 2024-02-29 DIAGNOSIS — R809 Proteinuria, unspecified: Secondary | ICD-10-CM | POA: Diagnosis present

## 2024-02-29 DIAGNOSIS — R0989 Other specified symptoms and signs involving the circulatory and respiratory systems: Secondary | ICD-10-CM

## 2024-02-29 DIAGNOSIS — N179 Acute kidney failure, unspecified: Secondary | ICD-10-CM | POA: Diagnosis not present

## 2024-02-29 DIAGNOSIS — E785 Hyperlipidemia, unspecified: Secondary | ICD-10-CM | POA: Insufficient documentation

## 2024-02-29 DIAGNOSIS — Z01818 Encounter for other preprocedural examination: Secondary | ICD-10-CM

## 2024-02-29 DIAGNOSIS — I739 Peripheral vascular disease, unspecified: Secondary | ICD-10-CM | POA: Insufficient documentation

## 2024-02-29 LAB — CBC
HCT: 31.7 % — ABNORMAL LOW (ref 36.0–46.0)
Hemoglobin: 10.2 g/dL — ABNORMAL LOW (ref 12.0–15.0)
MCH: 27.9 pg (ref 26.0–34.0)
MCHC: 32.2 g/dL (ref 30.0–36.0)
MCV: 86.6 fL (ref 80.0–100.0)
Platelets: 285 K/uL (ref 150–400)
RBC: 3.66 MIL/uL — ABNORMAL LOW (ref 3.87–5.11)
RDW: 17.2 % — ABNORMAL HIGH (ref 11.5–15.5)
WBC: 12.1 K/uL — ABNORMAL HIGH (ref 4.0–10.5)
nRBC: 0 % (ref 0.0–0.2)

## 2024-02-29 LAB — PROTIME-INR
INR: 0.9 (ref 0.8–1.2)
Prothrombin Time: 12.9 s (ref 11.4–15.2)

## 2024-02-29 MED ORDER — FENTANYL CITRATE (PF) 100 MCG/2ML IJ SOLN
INTRAMUSCULAR | Status: AC
  Filled 2024-02-29: qty 2

## 2024-02-29 MED ORDER — FENTANYL CITRATE (PF) 100 MCG/2ML IJ SOLN
INTRAMUSCULAR | Status: AC | PRN
  Administered 2024-02-29 (×2): 25 ug via INTRAVENOUS

## 2024-02-29 MED ORDER — MIDAZOLAM HCL (PF) 2 MG/2ML IJ SOLN
INTRAMUSCULAR | Status: AC | PRN
  Administered 2024-02-29 (×3): .5 mg via INTRAVENOUS

## 2024-02-29 MED ORDER — MIDAZOLAM HCL 2 MG/2ML IJ SOLN
INTRAMUSCULAR | Status: AC
  Filled 2024-02-29: qty 2

## 2024-02-29 MED ORDER — GELATIN ABSORBABLE 12-7 MM EX MISC
1.0000 | Freq: Once | CUTANEOUS | Status: AC
  Administered 2024-02-29: 1 via TOPICAL

## 2024-02-29 MED ORDER — LIDOCAINE HCL (PF) 1 % IJ SOLN
10.0000 mL | Freq: Once | INTRAMUSCULAR | Status: AC
  Administered 2024-02-29: 10 mL via INTRADERMAL

## 2024-02-29 MED ORDER — SODIUM CHLORIDE 0.9 % IV SOLN: INTRAVENOUS | Status: DC

## 2024-02-29 NOTE — Procedures (Signed)
 Pre procedural Dx: REXANN  Post procedural Dx: Same  Technically successful US  guided biopsy of kidney, right.  Gelfoam post.   EBL: None.   Complications: None immediate.   KANDICE Banner, MD Pager #: [phone]

## 2024-03-01 NOTE — Progress Notes (Unsigned)
 Patient ID: Olivia Cook                 DOB: Apr 04, 1950                      MRN: [id number]      HPI: Olivia Cook is a 74 y.o. female referred by Dr. Kate to HTN clinic. PMH is significant for COPD, hypertension, hyperlipidemia, hypothyroidism, On CT chest 04/01/2023 noted to have severe  three-vessel coronary calcifications, Mild (1-39% stenosis) in bilateral carotid arteries on duplex 10/2018.   Amlodipine  was started on 07/15/23 for elevated BP and later dose was increased to 10 mg.  At last visit with MD BP was elevated, BMP showed elevated BUN and serum creatinine and K level therefore hydrochlorothiazide  was changed to chlorthalidone  but is not back on hydrochlorothiazide . At previous visit hydralazine  25 mg three times daily was started.   Other BP meds includes Bisoprolol 5 mg twice daily, olmesartan  20 mg daily     The patient presented today for a blood pressure follow-up visit. She brought in home BP monitor it was inaccurate. She has been off of chlorthalidone  for last 1.5 weeks. Her home BP ~ 144-140/58-60 range.   Still on low salt diet and does not eat processed food.  Current HTN meds: Bisoprolol 5 mg twice daily, Olmesartan  20 mg daily, Amlodipine  5 mg twice daily, Hydralazine  25mg  three times daily, Hydrochlorothiazide  12.5mg  daily  Previously tried: Chlorthalidone  BP goal: <130/80  Family History:   Social History:   Diet:   Exercise:  {types:28256}  Home BP readings:  Date SBP/DBP  HR              Average      Wt Readings from Last 3 Encounters:  02/29/24 142 lb (64.4 kg)  02/16/24 154 lb (69.9 kg)  10/28/23 148 lb 9.6 oz (67.4 kg)   BP Readings from Last 3 Encounters:  02/29/24 (!) 151/56  02/16/24 (!) 160/50  12/07/23 135/62   Pulse Readings from Last 3 Encounters:  02/29/24 80  02/16/24 81  12/07/23 69    Renal function: CrCl cannot be calculated (Patient's most recent lab result is older than the maximum 21 days allowed.).  Past  Medical History:  Diagnosis Date   Allergy    SEASONAL   Anxiety    Arthritis    Emphysema of lung (HCC)    Hyperlipidemia    Hypertension    Thyroid  disease     Medications Ordered Prior to Encounter[1]  Allergies[2]  There were no vitals taken for this visit.   Assessment/Plan:  1. Hypertension -  No problems updated. No problem-specific Assessment & Plan notes found for this encounter.      Thank you  Olivia Cook, Pharm.D [facility]sp[facility] BIR[facility]. [facility] [address], [place], [place] [address] Phone: [phone]; Fax: [phone]       [1]  Current Outpatient Medications on File Prior to Visit  Medication Sig Dispense Refill   acetaminophen (TYLENOL) 500 MG tablet Take 500 mg by mouth every 6 (six) hours as needed.     albuterol (PROVENTIL) (2.5 MG/3ML) 0.083% nebulizer solution Take 2.5 mg by nebulization every 6 (six) hours as needed.     amLODipine  (NORVASC ) 10 MG tablet Take 1 tablet (10 mg total) by mouth daily. 90 tablet 3   aspirin EC 81 MG tablet Take 81 mg by mouth daily.  atorvastatin (LIPITOR) 20 MG tablet Take 20 mg by mouth daily.     bisoprolol (ZEBETA) 5 MG tablet Take by mouth. (Patient taking differently: Take 5 mg by mouth 2 (two) times daily.)     Blood Pressure Monitoring (OMRON 3 SERIES BP MONITOR) DEVI Use to check blood pressure daily. 1 each 0   budesonide-formoterol (SYMBICORT) 160-4.5 MCG/ACT inhaler Inhale 2 puffs into the lungs 2 (two) times daily.     buPROPion (WELLBUTRIN SR) 150 MG 12 hr tablet Take 150 mg by mouth daily.     cyclobenzaprine (FLEXERIL) 10 MG tablet Take 10 mg by mouth at bedtime.     furosemide (LASIX) 20 MG tablet Take 20 mg by mouth daily.     hydrALAZINE  (APRESOLINE ) 25 MG tablet Take 1 tablet (25 mg total) by mouth 3 (three) times daily. 270 tablet 3   levothyroxine (SYNTHROID) 100 MCG tablet Take by mouth.     Magnesium  Oxide -Mg Supplement 400 MG CAPS Take 400  mg by mouth daily. 90 capsule 3   olmesartan  (BENICAR ) 5 MG tablet Take 2 tablets (10 mg total) by mouth daily. (Patient not taking: Reported on 02/16/2024) 180 tablet 3   OVER THE COUNTER MEDICATION 1 tablet every morning. (Patient not taking: Reported on 02/16/2024)     predniSONE (DELTASONE) 10 MG tablet Take 10 mg by mouth daily with breakfast.     No current facility-administered medications on file prior to visit.  [2] No Known Allergies

## 2024-03-02 ENCOUNTER — Encounter (HOSPITAL_COMMUNITY): Payer: Self-pay

## 2024-03-02 ENCOUNTER — Ambulatory Visit: Admitting: Pharmacist

## 2024-03-02 LAB — SURGICAL PATHOLOGY

## 2024-03-02 NOTE — Progress Notes (Signed)
 " Cardiology Office Note:    Date:  03/03/2024   ID:  DAWNN NAM, DOB Jan 13, 1951, MRN [id number]  PCP:  Yolande Toribio MATSU, MD  Cardiologist:  Lonni LITTIE Nanas, MD  Electrophysiologist:  None   Referring MD: Yolande Toribio MATSU, MD   Chief Complaint  Patient presents with   Congestive Heart Failure    History of Present Illness:    Olivia Cook is a 74 y.o. female with a hx of COPD, hypertension, hyperlipidemia, hypothyroidism who presents for follow-up.  She was referred by Dr. Yolande for evaluation of CAD,seen 07/07/2023.  On CT chest 04/01/2023 noted to have severe  three-vessel coronary calcifications.  Denies any chest pain, lightheadedness, syncope, lower extremity edema, or palpitations.  She does report she has been having shortness of breath, especially with going up and down stairs.  Reports pain in legs with walking upstairs.  She had some lower extremity edema when started diltiazem but has improved.  She denies any lightheadedness or syncope.  She smoked for 50 years, about 0.5 packs/day at peak but now down to 2 to 3 cigarettes/day.  Family history includes father CABG in 19s and mother had CHF.  Echocardiogram 09/07/2023 showed normal biventricular function, no significant valvular disease.  Lexiscan  Myoview  10/07/2023 showed normal perfusion, LVEF 74%, severe coronary calcifications.  ABIs 08/2023 were 0.42 on right, 0.52 on left.  Lower extremity duplex showed monophasic waveforms in bilateral CFA consistent with inflow disease.  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  At clinic visit 12/30 she was noted to be significantly volume overloaded and sent to ED.  She was treated for COPD exacerbation as well as diuresed with IV Lasix.  Echocardiogram 02/18/2024 showed EF 55 to 60%.    Since last clinic visit, she reports she is doing much better.  States that she continues to have some shortness of breath  but significantly improved after recent admission.  Weight is down 12 pounds from prior clinic visit.  Continues to have some swelling in her legs.  Denies any chest pain.  She saw a nephrologist yesterday.  Wt Readings from Last 3 Encounters:  03/03/24 142 lb 12.8 oz (64.8 kg)  02/29/24 142 lb (64.4 kg)  02/16/24 154 lb (69.9 kg)      Past Medical History:  Diagnosis Date   Allergy    SEASONAL   Anxiety    Arthritis    Emphysema of lung (HCC)    Hyperlipidemia    Hypertension    Thyroid  disease     Past Surgical History:  Procedure Laterality Date   COLONOSCOPY     POLYPECTOMY     VAGINAL HYSTERECTOMY N/A 1978    Current Medications: Current Meds  Medication Sig   acetaminophen (TYLENOL) 500 MG tablet Take 500 mg by mouth every 6 (six) hours as needed.   albuterol (PROVENTIL) (2.5 MG/3ML) 0.083% nebulizer solution Take 2.5 mg by nebulization every 6 (six) hours as needed.   amLODipine  (NORVASC ) 10 MG tablet Take 1 tablet (10 mg total) by mouth daily.   aspirin EC 81 MG tablet Take 81 mg by mouth daily.   atorvastatin (LIPITOR) 20 MG tablet Take 20 mg by mouth daily.   bisoprolol (ZEBETA) 5 MG tablet Take by mouth. (Patient taking differently: Take 5 mg by mouth 2 (two) times daily.)   Blood Pressure Monitoring (OMRON 3 SERIES BP MONITOR) DEVI Use to check blood pressure daily.  buPROPion (WELLBUTRIN SR) 150 MG 12 hr tablet Take 150 mg by mouth daily.   ferrous sulfate 325 (65 FE) MG tablet Take 325 mg by mouth daily with breakfast.   furosemide (LASIX) 20 MG tablet Take 20 mg by mouth daily.   levothyroxine (SYNTHROID) 100 MCG tablet Take by mouth.   Magnesium  Oxide -Mg Supplement 400 MG CAPS Take 400 mg by mouth daily.   olmesartan  (BENICAR ) 20 MG tablet Take 20 mg by mouth daily.   predniSONE (DELTASONE) 10 MG tablet Take 10 mg by mouth daily with breakfast.     Allergies:   Patient has no known allergies.   Social History   Socioeconomic History   Marital  status: Widowed    Spouse name: Not on file   Number of children: Not on file   Years of education: Not on file   Highest education level: Not on file  Occupational History   Not on file  Tobacco Use   Smoking status: Former    Types: Cigarettes   Smokeless tobacco: Never   Tobacco comments:    SMOKES SOME DAYS, NOT DAILY   Vaping Use   Vaping status: Never Used  Substance and Sexual Activity   Alcohol  use: No   Drug use: No   Sexual activity: Not on file  Other Topics Concern   Not on file  Social History Narrative   Not on file   Social Drivers of Health   Tobacco Use: Medium Risk (03/03/2024)   Patient History    Smoking Tobacco Use: Former    Smokeless Tobacco Use: Never    Passive Exposure: Not on Actuary Strain: Not on file  Food Insecurity: No Food Insecurity (02/19/2024)   Received from [facility]   Epic    Within the past 12 months, you worried that your food would run out before you got the money to buy more.: Never true    Within the past 12 months, the food you bought just didn't last and you didn't have money to get more.: Never true  Transportation Needs: No Transportation Needs (02/19/2024)   Received from [facility]    In the past 12 months, has lack of transportation kept you from medical appointments or from getting medications?: No    In the past 12 months, has lack of transportation kept you from meetings, work, or from getting things needed for daily living?: No  Physical Activity: Not on file  Stress: No Stress Concern Present (02/19/2024)   Received from [facility] - Occupational Stress Questionnaire    Do you feel stress - tense, restless, nervous, or anxious, or unable to sleep at night because your mind is troubled all the time - these days?: Not at all  Social Connections: Not on file  Depression (PHQ2-9): Not on file  Alcohol  Screen: Not on file  Housing: Low Risk  (02/19/2024)   Received from [facility]    In the last 12 months, was there a time when you were not able to pay the mortgage or rent on time?: No    In the past 12 months, how many times have you moved where you were living?: 0    At any time in the past 12 months, were you homeless or living in a shelter (including now)?: No  Utilities: Not At Risk (02/19/2024)   Received from [facility]  In the past 12 months has the electric, gas, oil, or water company threatened to shut off services in your home?: No  Health Literacy: Not on file     Family History: The patient's family history includes Colon cancer in her sister. There is no history of Esophageal cancer, Rectal cancer, Stomach cancer, or Colon polyps.  ROS:   Please see the history of present illness.     All other systems reviewed and are negative.  EKGs/Labs/Other Studies Reviewed:    The following studies were reviewed today:   EKG:   07/15/2023: Normal sinus rhythm, rate 69, less than 1 mm ST depressions in leads II, aVF and V4-6, nonspecific T wave flattening 02/16/24: Normal sinus rhythm, rate 83, no ST abnormalities  Recent Labs: 07/15/2023: ALT 14 11/23/2023: Magnesium  2.1 12/16/2023: BUN 40; Creatinine, Ser 1.59; Potassium 5.0; Sodium 147 02/29/2024: Hemoglobin 10.2; Platelets 285  Recent Lipid Panel    Component Value Date/Time   CHOL 164 10/20/2023 1632   TRIG 348 (H) 10/20/2023 1632   HDL 39 (L) 10/20/2023 1632   CHOLHDL 4.2 10/20/2023 1632   LDLCALC 70 10/20/2023 1632    Physical Exam:    VS:  BP 130/62 (BP Location: Left Arm, Patient Position: Sitting, Cuff Size: Normal)   Pulse 79   Ht 4' 10 (1.473 m)   Wt 142 lb 12.8 oz (64.8 kg)   SpO2 92%   BMI 29.85 kg/m     Wt Readings from Last 3 Encounters:  03/03/24 142 lb 12.8 oz (64.8 kg)  02/29/24 142 lb (64.4 kg)  02/16/24 154 lb (69.9 kg)     GEN:   in no acute distress HEENT: Normal NECK: No JVD; right carotid  bruit CARDIAC: RRR, no murmurs, rubs, gallops RESPIRATORY: Bibasilar crackles ABDOMEN: Soft, non-tender, non-distended MUSCULOSKELETAL: 1+ lower extremity edema; No deformity  SKIN: Warm and dry NEUROLOGIC:  Alert and oriented x 3 PSYCHIATRIC:  Normal affect   ASSESSMENT:    1. Chronic diastolic heart failure (HCC)   2. Coronary artery disease involving native coronary artery of native heart without angina pectoris   3. Stenosis of carotid artery, unspecified laterality   4. Peripheral arterial disease   5. Essential hypertension   6. Hyperlipidemia, unspecified hyperlipidemia type   7. Stage 3b chronic kidney disease (HCC)       PLAN:    Chronic diastolic heart failure: At clinic visit 12/30 she was noted to be significantly volume overloaded and sent to ED.  She was treated for COPD exacerbation as well as diuresed with IV Lasix.  Echocardiogram 02/18/2024 showed EF 55 to 60%.   - Weight is down 12 pounds from prior clinic visit.  Volume status significantly improved.  Continue p.o. Lasix  CAD: Severe multivessel coronary calcifications noted on CT chest 03/2023.  She denies chest pain but is reporting dyspnea on exertion that could represent anginal equivalent.  Echocardiogram 09/07/2023 showed normal biventricular function, no significant valvular disease.  Lexiscan  Myoview  10/07/2023 showed normal perfusion, LVEF 74%, severe coronary calcifications.  Denies any exertional symptoms - Continue aspirin 81 mg daily - Continue atorvastatin 20 mg daily  PAD: she reported having leg pain. ABIs 08/2023 were 0.42 on right, 0.52 on left.  Lower extremity duplex showed monophasic waveforms in bilateral CFA consistent with inflow disease.  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 -Referred to Dr Court for evaluation for peripheral intervention, monitoring for now given claudication  not affecting activities of daily  living -Continue aspirin, statin  Hypertension: On bisoprolol 5 mg daily,  amlodipine  10 mg daily, olmesartan  20 mg daily, hydralazine  50 mg 3 times daily.  Appears controlled  Hyperlipidemia: On atorvastatin 20 mg daily.  LDL 70 on 10/20/2023  Carotid stenosis: Mild (1-39% stenosis) in bilateral carotid arteries on duplex 10/2018.  Will update carotid duplex  Tobacco use: Counseled on risk of tobacco use and cessation strongly encouraged  CKD stage IIIb: Creatinine 2.0 on 01/23/2024.  Follows with nephrologist, reports was seen yesterday  Non-small cell lung cancer: Stage I, she is undergoing radiation  RTC in 3 months   Medication Adjustments/Labs and Tests Ordered: Current medicines are reviewed at length with the patient today.  Concerns regarding medicines are outlined above.  Orders Placed This Encounter  Procedures   VAS US  CAROTID   Meds ordered this encounter  Medications   hydrALAZINE  (APRESOLINE ) 50 MG tablet    Sig: Take 1 tablet (50 mg total) by mouth 3 (three) times daily.    Dispense:  270 tablet    Refill:  3    Patient Instructions  Medication Instructions:  Your physician recommends that you continue on your current medications as directed. Please refer to the Current Medication list given to you today.  *If you need a refill on your cardiac medications before your next appointment, please call your pharmacy*  Testing/Procedures: Your physician has requested that you have a carotid duplex. This test is an ultrasound of the carotid arteries in your neck. It looks at blood flow through these arteries that supply the brain with blood. Allow one hour for this exam. There are no restrictions or special instructions.   Follow-Up: At [facility], you and your health needs are our priority.  As part of our continuing mission to provide you with exceptional heart care, our providers are all part of one team.  This team includes your primary Cardiologist  (physician) and Advanced Practice Providers or APPs (Physician Assistants and Nurse Practitioners) who all work together to provide you with the care you need, when you need it.  Your next appointment:   3 month(s)  Provider:   Lonni LITTIE Nanas, MD    Other Instructions:            Signed, Lonni LITTIE Nanas, MD  03/03/2024 4:02 PM    [facility] "

## 2024-03-03 ENCOUNTER — Ambulatory Visit: Attending: Cardiology | Admitting: Cardiology

## 2024-03-03 ENCOUNTER — Encounter: Payer: Self-pay | Admitting: Cardiology

## 2024-03-03 VITALS — BP 130/62 | HR 79 | Ht <= 58 in | Wt 142.8 lb

## 2024-03-03 DIAGNOSIS — E785 Hyperlipidemia, unspecified: Secondary | ICD-10-CM | POA: Diagnosis not present

## 2024-03-03 DIAGNOSIS — N1832 Chronic kidney disease, stage 3b: Secondary | ICD-10-CM

## 2024-03-03 DIAGNOSIS — I6529 Occlusion and stenosis of unspecified carotid artery: Secondary | ICD-10-CM | POA: Diagnosis not present

## 2024-03-03 DIAGNOSIS — I2510 Atherosclerotic heart disease of native coronary artery without angina pectoris: Secondary | ICD-10-CM

## 2024-03-03 DIAGNOSIS — I5032 Chronic diastolic (congestive) heart failure: Secondary | ICD-10-CM | POA: Diagnosis not present

## 2024-03-03 DIAGNOSIS — I10 Essential (primary) hypertension: Secondary | ICD-10-CM

## 2024-03-03 DIAGNOSIS — I739 Peripheral vascular disease, unspecified: Secondary | ICD-10-CM

## 2024-03-03 MED ORDER — HYDRALAZINE HCL 50 MG PO TABS: 50.0000 mg | ORAL_TABLET | Freq: Three times a day (TID) | ORAL | 3 refills | Status: AC

## 2024-03-03 NOTE — Patient Instructions (Signed)
 Medication Instructions:  Your physician recommends that you continue on your current medications as directed. Please refer to the Current Medication list given to you today.  *If you need a refill on your cardiac medications before your next appointment, please call your pharmacy*  Testing/Procedures: Your physician has requested that you have a carotid duplex. This test is an ultrasound of the carotid arteries in your neck. It looks at blood flow through these arteries that supply the brain with blood. Allow one hour for this exam. There are no restrictions or special instructions.   Follow-Up: At [facility], you and your health needs are our priority.  As part of our continuing mission to provide you with exceptional heart care, our providers are all part of one team.  This team includes your primary Cardiologist (physician) and Advanced Practice Providers or APPs (Physician Assistants and Nurse Practitioners) who all work together to provide you with the care you need, when you need it.  Your next appointment:   3 month(s)  Provider:   Lonni LITTIE Nanas, MD    Other Instructions:

## 2024-03-23 ENCOUNTER — Ambulatory Visit (HOSPITAL_COMMUNITY): Admission: RE | Admit: 2024-03-23 | Discharge: 2024-03-23 | Attending: Cardiology

## 2024-03-23 DIAGNOSIS — I6529 Occlusion and stenosis of unspecified carotid artery: Secondary | ICD-10-CM

## 2024-03-24 ENCOUNTER — Ambulatory Visit: Payer: Self-pay | Admitting: Cardiology

## 2024-03-24 DIAGNOSIS — I6529 Occlusion and stenosis of unspecified carotid artery: Secondary | ICD-10-CM

## 2024-06-07 ENCOUNTER — Ambulatory Visit: Admitting: Cardiology
